# Patient Record
Sex: Male | Born: 1955 | Race: White | Hispanic: No | Smoking: Never smoker
Health system: Southern US, Community
[De-identification: ages and names within clinical notes are randomized; demographics above are authoritative.]

---

## 2017-10-08 ENCOUNTER — Ambulatory Visit
Admission: RE | Admit: 2017-10-08 | Discharge: 2017-10-08 | Disposition: A | Discharge status: Home / Self Care | Payer: Worker's Compensation | Source: Ambulatory Visit | Attending: Family Medicine | Admitting: Family Medicine

## 2017-10-08 ENCOUNTER — Other Ambulatory Visit: Payer: Self-pay | Admitting: Family Medicine

## 2017-10-08 DIAGNOSIS — X58XXXA Exposure to other specified factors, initial encounter: Secondary | ICD-10-CM | POA: Insufficient documentation

## 2017-10-08 DIAGNOSIS — S67193A Crushing injury of left middle finger, initial encounter: Secondary | ICD-10-CM | POA: Diagnosis present

## 2017-10-08 DIAGNOSIS — S62642A Nondisplaced fracture of proximal phalanx of right middle finger, initial encounter for closed fracture: Secondary | ICD-10-CM | POA: Insufficient documentation

## 2017-10-08 DIAGNOSIS — R52 Pain, unspecified: Secondary | ICD-10-CM

## 2017-12-10 ENCOUNTER — Other Ambulatory Visit: Payer: Self-pay | Admitting: Family Medicine

## 2017-12-10 ENCOUNTER — Ambulatory Visit
Admission: RE | Admit: 2017-12-10 | Discharge: 2017-12-10 | Disposition: A | Discharge status: Home / Self Care | Payer: Worker's Compensation | Source: Ambulatory Visit | Attending: Family Medicine | Admitting: Family Medicine

## 2017-12-10 DIAGNOSIS — M79646 Pain in unspecified finger(s): Secondary | ICD-10-CM | POA: Diagnosis present

## 2017-12-10 DIAGNOSIS — R52 Pain, unspecified: Secondary | ICD-10-CM

## 2017-12-10 DIAGNOSIS — S62633K Displaced fracture of distal phalanx of left middle finger, subsequent encounter for fracture with nonunion: Secondary | ICD-10-CM | POA: Insufficient documentation

## 2017-12-10 DIAGNOSIS — X58XXXD Exposure to other specified factors, subsequent encounter: Secondary | ICD-10-CM | POA: Insufficient documentation

## 2017-12-12 DIAGNOSIS — S62609A Fracture of unspecified phalanx of unspecified finger, initial encounter for closed fracture: Secondary | ICD-10-CM | POA: Insufficient documentation

## 2019-04-12 IMAGING — CR DG FINGER MIDDLE 2+V*L*
1 series · 3 of 3 positions shown · non-contrast
Comparison: None.

CLINICAL DATA: Pain due to crush injury

EXAM:
LEFT MIDDLE FINGER 2+V

[Series 1: dg finger middle left · 0.14mm/px · 3 of 3 slices shown]
[im 1/3]
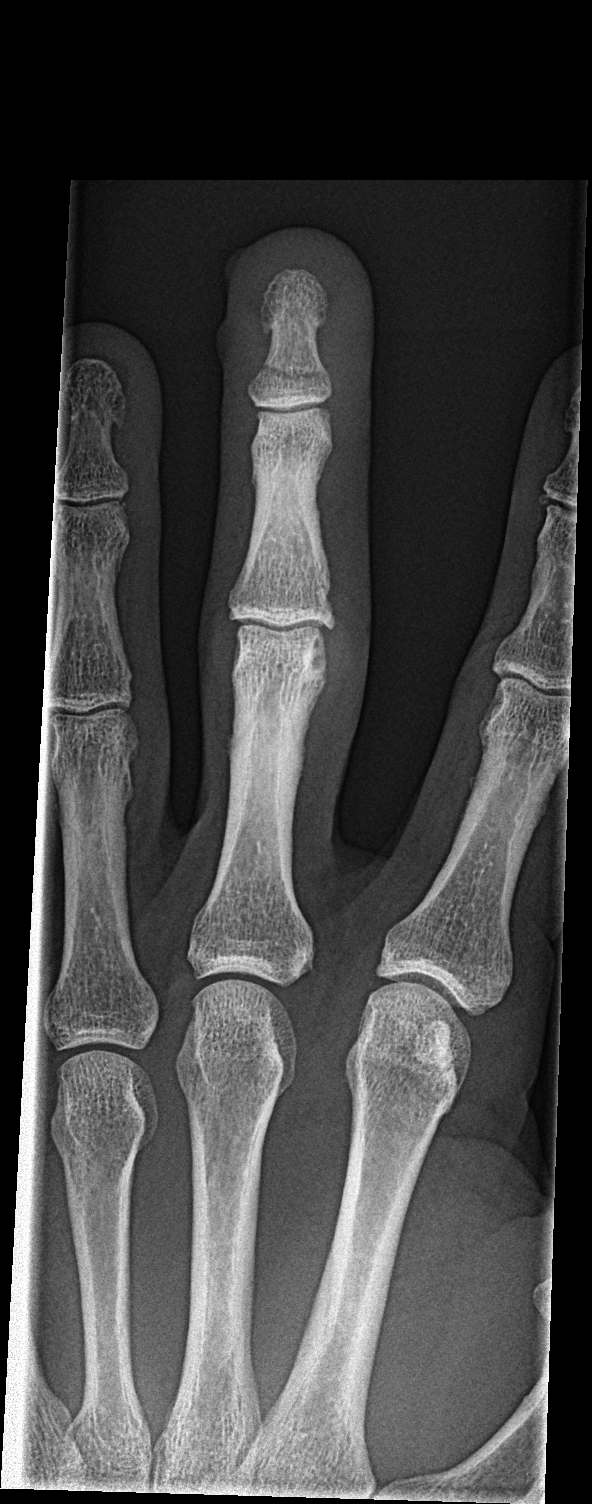
[im 2/3]
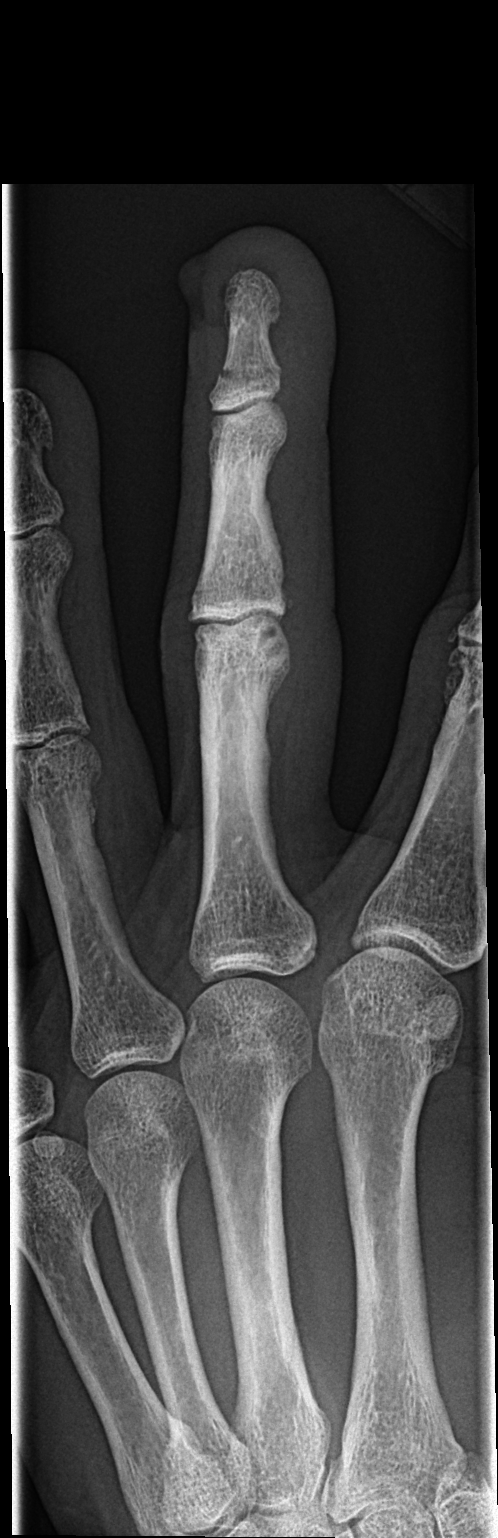
[im 3/3]
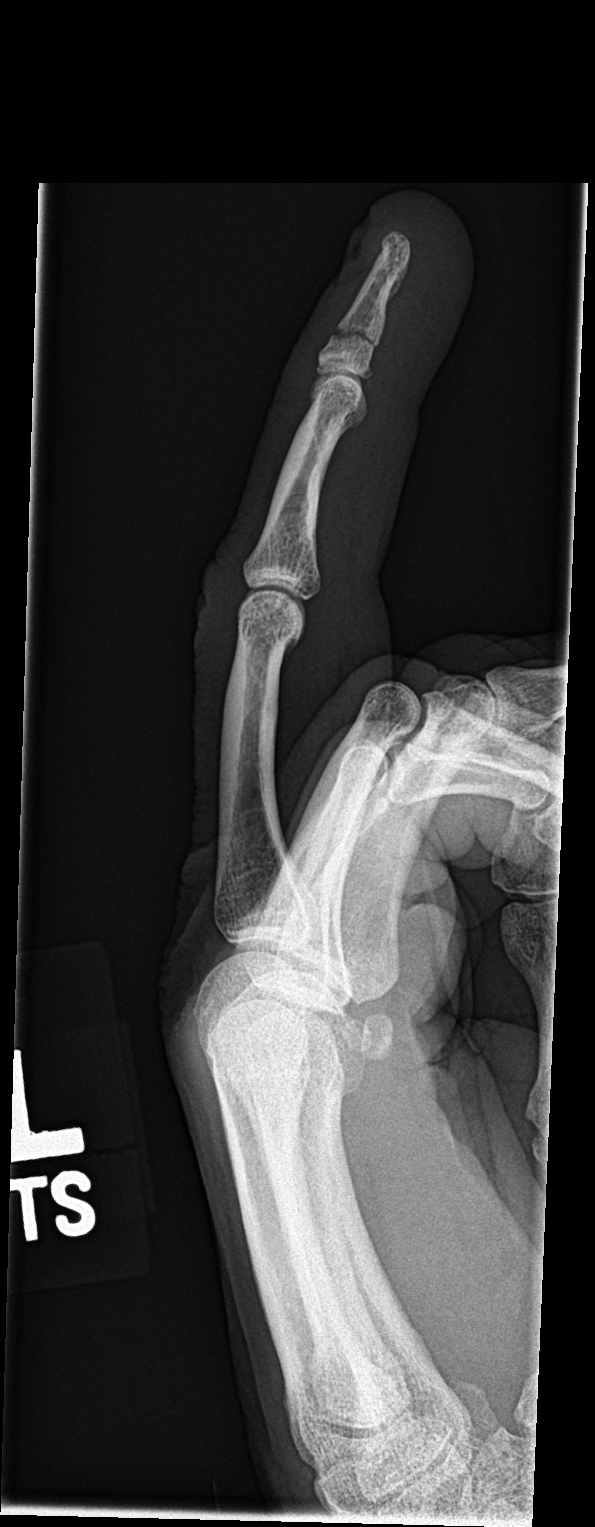

[3 of 3 positions shown; findings below may reference images not displayed]

FINDINGS: Nondisplaced acute fracture involving the proximal metaphysis of the
third distal phalanx without definitive articular extension. No
subluxation or radiopaque foreign body
IMPRESSION: Acute nondisplaced fracture involving the proximal aspect of the
third distal phalanx

## 2019-06-14 IMAGING — CR DG FINGER MIDDLE 2+V*L*
1 series · 3 of 3 positions shown · non-contrast
Comparison: Plain film of the left third finger dated 10/08/2017.

CLINICAL DATA: Pt fractured his left middle finger in September 2017
and started having redness/ edema distal phalanx 2 weeks ago. Pt
states he did not re injure his finger. shielded

EXAM:
LEFT MIDDLE FINGER 2+V

[Series 1: dg finger middle left · 0.14mm/px · 3 of 3 slices shown]
[im 1/3]
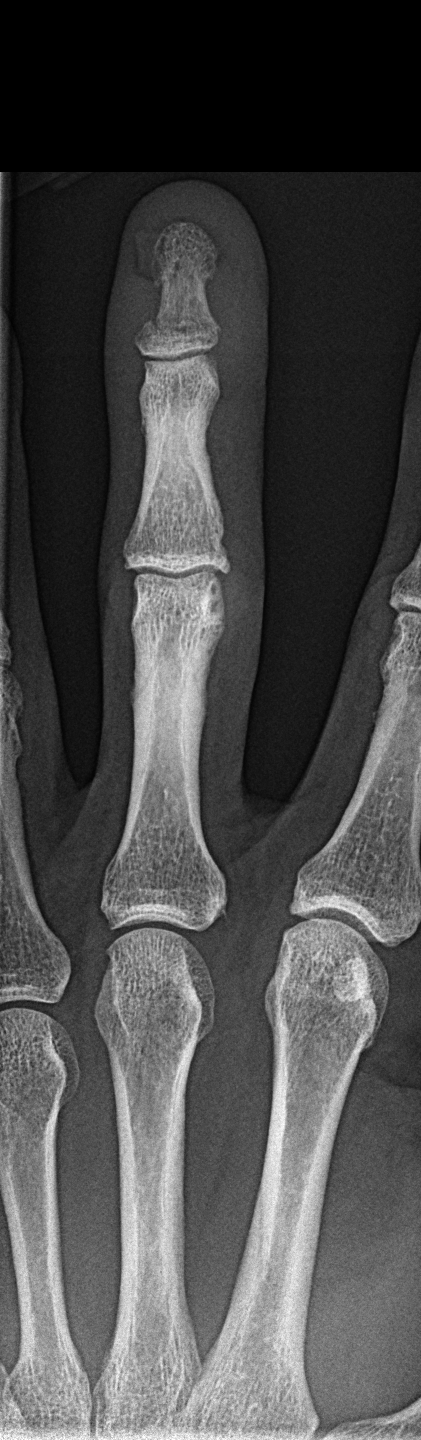
[im 2/3]
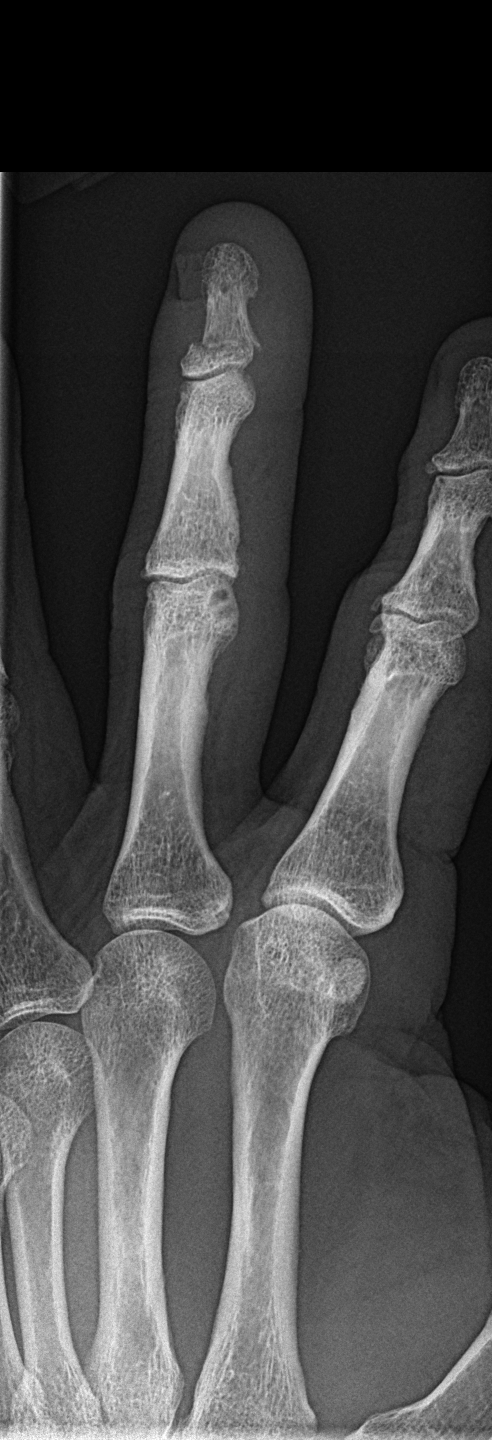
[im 3/3]
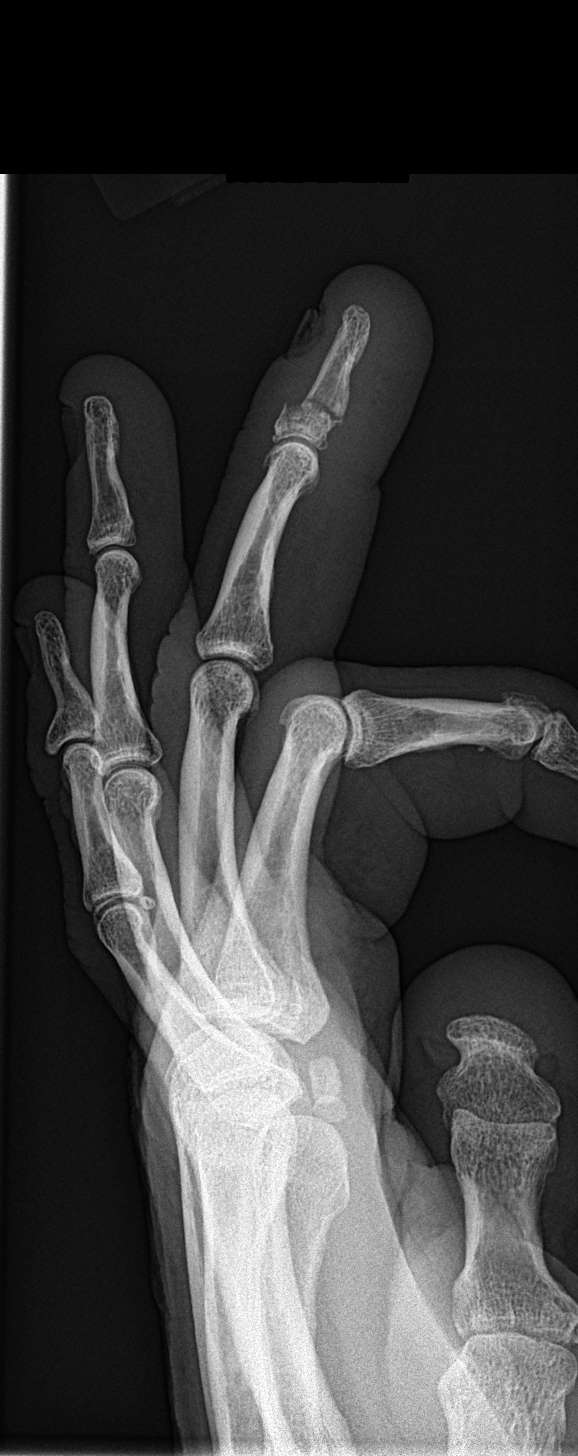

[3 of 3 positions shown; findings below may reference images not displayed]

FINDINGS: Slightly displaced fracture displacement at the base of the distal
phalanx, increased cortical offset which now measures approximately
1.5 mm. No convincing healing callus formation seen. Alignment at
the underlying DIP joint remains normal. Surrounding soft tissues
are unremarkable.
IMPRESSION: Increased fracture displacement at the base of the distal phalanx,
with cortical offset now measuring approximately 1.5 mm (no cortical
offset on the previous exam of 10/08/2017). No convincing healing
callus formation seen as yet.

## 2019-12-23 ENCOUNTER — Other Ambulatory Visit: Payer: Self-pay

## 2019-12-23 ENCOUNTER — Ambulatory Visit: Payer: Self-pay

## 2019-12-23 DIAGNOSIS — Z0000 Encounter for general adult medical examination without abnormal findings: Secondary | ICD-10-CM

## 2019-12-23 LAB — POCT URINALYSIS DIPSTICK
Bilirubin, UA: NEGATIVE
Blood, UA: NEGATIVE
Glucose, UA: NEGATIVE
Ketones, UA: NEGATIVE
Leukocytes, UA: NEGATIVE
Nitrite, UA: NEGATIVE
Protein, UA: NEGATIVE
Spec Grav, UA: 1.015 (ref 1.010–1.025)
Urobilinogen, UA: 0.2 E.U./dL
pH, UA: 6 (ref 5.0–8.0)

## 2019-12-23 NOTE — Progress Notes (Signed)
Patient comes in today for pre physical labs and EKG. Patient is scheduled with Durward Parcel, PA-C on 01/04/20.

## 2019-12-24 LAB — CMP12+LP+TP+TSH+6AC+PSA+CBC…
ALT: 17 IU/L (ref 0–44)
AST: 28 IU/L (ref 0–40)
Albumin/Globulin Ratio: 1.9 (ref 1.2–2.2)
Albumin: 4.5 g/dL (ref 3.8–4.8)
Alkaline Phosphatase: 65 IU/L (ref 39–117)
BUN/Creatinine Ratio: 12 (ref 10–24)
Basophils Absolute: 0.1 10*3/uL (ref 0.0–0.2)
Basos: 1 %
Calcium: 9.4 mg/dL (ref 8.6–10.2)
Chloride: 101 mmol/L (ref 96–106)
Chol/HDL Ratio: 2.2 ratio (ref 0.0–5.0)
Cholesterol, Total: 195 mg/dL (ref 100–199)
EOS (ABSOLUTE): 0.1 10*3/uL (ref 0.0–0.4)
Eos: 2 %
Free Thyroxine Index: 1.7 (ref 1.2–4.9)
GFR calc non Af Amer: 88 mL/min/{1.73_m2} (ref >59)
Globulin, Total: 2.4 g/dL (ref 1.5–4.5)
HDL: 90 mg/dL (ref >39)
Hematocrit: 43.7 % (ref 37.5–51.0)
Immature Grans (Abs): 0.1 10*3/uL (ref 0.0–0.1)
Immature Granulocytes: 1 %
LDH: 241 IU/L — ABNORMAL HIGH (ref 121–224)
MCH: 31.2 pg (ref 26.6–33.0)
MCV: 92 fL (ref 79–97)
Neutrophils Absolute: 3.3 10*3/uL (ref 1.4–7.0)
Phosphorus: 3.8 mg/dL (ref 2.8–4.1)
Prostate Specific Ag, Serum: 2.7 ng/mL (ref 0.0–4.0)
RDW: 12 % (ref 11.6–15.4)
TSH: 1.87 u[IU]/mL (ref 0.450–4.500)
Uric Acid: 5.9 mg/dL (ref 3.8–8.4)
WBC: 5.2 10*3/uL (ref 3.4–10.8)

## 2019-12-24 LAB — CMP12+LP+TP+TSH+6AC+PSA+CBC?
BUN: 11 mg/dL (ref 8–27)
Bilirubin Total: 0.4 mg/dL (ref 0.0–1.2)
Creatinine, Ser: 0.93 mg/dL (ref 0.76–1.27)
Estimated CHD Risk: <0.5 times avg. (ref 0.0–1.0)
GFR calc Af Amer: 101 mL/min/{1.73_m2} (ref >59)
GGT: 20 IU/L (ref 0–65)
Glucose: 93 mg/dL (ref 65–99)
Hemoglobin: 14.9 g/dL (ref 13.0–17.7)
Iron: 69 ug/dL (ref 38–169)
LDL Chol Calc (NIH): 96 mg/dL (ref 0–99)
Lymphocytes Absolute: 1.2 10*3/uL (ref 0.7–3.1)
Lymphs: 23 %
MCHC: 34.1 g/dL (ref 31.5–35.7)
Monocytes Absolute: 0.5 10*3/uL (ref 0.1–0.9)
Monocytes: 9 %
Neutrophils: 64 %
Platelets: 143 10*3/uL — ABNORMAL LOW (ref 150–450)
Potassium: 5.1 mmol/L (ref 3.5–5.2)
RBC: 4.77 x10E6/uL (ref 4.14–5.80)
Sodium: 139 mmol/L (ref 134–144)
T3 Uptake Ratio: 24 % (ref 24–39)
T4, Total: 6.9 ug/dL (ref 4.5–12.0)
Total Protein: 6.9 g/dL (ref 6.0–8.5)
Triglycerides: 46 mg/dL (ref 0–149)
VLDL Cholesterol Cal: 9 mg/dL (ref 5–40)

## 2020-01-04 ENCOUNTER — Ambulatory Visit: Payer: Self-pay | Admitting: Physician Assistant

## 2020-01-04 ENCOUNTER — Other Ambulatory Visit: Payer: Self-pay

## 2020-01-04 VITALS — BP 108/80 | HR 80 | Temp 98.3°F | Ht 73.0 in | Wt 160.6 lb

## 2020-01-04 DIAGNOSIS — Z0000 Encounter for general adult medical examination without abnormal findings: Secondary | ICD-10-CM

## 2020-01-04 MED ORDER — SILDENAFIL CITRATE 100 MG PO TABS: 50.0000 mg | ORAL_TABLET | Freq: Every day | ORAL | 11 refills | Status: DC | PRN

## 2020-01-04 NOTE — Progress Notes (Signed)
   Subjective: Routine annual physical    Patient ID: Gabriel George, male    DOB: December 08, 1955, 64 y.o.   MRN: [id number]  HPI Patient presents for routine annual physical was normal.  Complaints   Review of Systems Unremarkable    Objective:   Physical Exam HEENT was unremarkable.  Neck is supple without adenopathy or bruits.  Lungs are clear to auscultation.  Heart regular rate and rhythm.  Abdomen with negative HSM.  Normoactive bowel sounds.  Soft nontender palpation.  No obvious deformity to cervical lumbar spine.  Patient full equal range of motion cervical lumbar spine.  No obvious deformity to the lower and upper extremities.  Patient full equal range of motion of the lower and upper ex extremities.  Cranial nerves II through XII grossly intact.       Assessment & Plan: Well exam.  Discussed lab results with patient advised follow-up as necessary.

## 2020-11-08 ENCOUNTER — Ambulatory Visit: Payer: Self-pay | Admitting: Nurse Practitioner

## 2020-11-08 ENCOUNTER — Other Ambulatory Visit: Payer: Self-pay

## 2020-11-08 ENCOUNTER — Encounter: Payer: Self-pay | Admitting: Nurse Practitioner

## 2020-11-08 VITALS — BP 120/74 | HR 99 | Temp 98.4°F | Resp 14 | Ht 73.0 in | Wt 160.0 lb

## 2020-11-08 DIAGNOSIS — M25441 Effusion, right hand: Secondary | ICD-10-CM

## 2020-11-08 NOTE — Progress Notes (Signed)
Pt presents today with right hand pain for about 2 weeks. Pt middle knuckle and finger bother him he stated, that he cant touch his middle finger to the middle palm of his right hand and the knuckle area is swollen.

## 2020-11-08 NOTE — Progress Notes (Signed)
   Subjective:    Patient ID: Gabriel George, male    DOB: [birth year], 64 y.o.   MRN: [id number]  HPI 64 year old male presenting with on and off right hand middle finger proximal joint swelling with decreased ROM.   He has taken Aleve intermittently for relief.   About a month ago his dog leash got wrapped around his middle finger on his right hand and pulled him and he knows he injured it at that time but did not seek medical care.   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   After working his hand will swell.  Denies any numbness or change in sensation to right hand.   No other generalized joint swelling systemically, father had arthritis  Today's Vitals   11/08/20 1449  BP: 120/74  Pulse: 99  Resp: 14  Temp: 98.4 F (36.9 C)  SpO2: 95%  Weight: 160 lb (72.6 kg)  Height: 6\' 1"  (1.854 m)   Body mass index is 21.11 kg/m. Review of Systems  Constitutional: Negative.   HENT: Negative.   Respiratory: Negative.   Cardiovascular: Negative.   Musculoskeletal: Positive for arthralgias and joint swelling.       Objective:   Physical Exam Constitutional:      Appearance: He is normal weight.  Musculoskeletal:     Right hand: Swelling and tenderness present. Decreased range of motion. Normal strength. Normal sensation.       Hands:     Comments: Right hand third finger proximal joint with slight inflammation, unable to touch third finger to palm of hand. Pain at joint on palpation (dorsal and ventral) Sensation intact throughout finger Distal joints without swelling or erythema Skin color uniform throughout   Skin:    General: Skin is dry.  Neurological:     Mental Status: He is alert and oriented to person, place, and time.  Psychiatric:        Mood and Affect: Mood normal.           Assessment & Plan:  Offered XRAY of finger to assess for injury secondary to incident with dog last  month He may try Aleve consistently for one week and decrease work with hands first and if symptoms persist continue with XRAY for evaluation  RTC if symptoms persist/worsen or with new concerns  Discussed the opportunity for bloodwork if he would like to pursue a workup for arthritis as well

## 2020-12-27 NOTE — Progress Notes (Signed)
Pt scheduled to complete physical 01/04/21./CL,RMA

## 2020-12-28 ENCOUNTER — Ambulatory Visit: Payer: Self-pay

## 2020-12-28 ENCOUNTER — Other Ambulatory Visit: Payer: Self-pay

## 2020-12-28 DIAGNOSIS — Z0000 Encounter for general adult medical examination without abnormal findings: Secondary | ICD-10-CM

## 2020-12-28 DIAGNOSIS — Z01818 Encounter for other preprocedural examination: Secondary | ICD-10-CM

## 2020-12-28 LAB — POCT URINALYSIS DIPSTICK
Bilirubin, UA: NEGATIVE
Blood, UA: NEGATIVE
Glucose, UA: NEGATIVE
Ketones, UA: NEGATIVE
Leukocytes, UA: NEGATIVE
Nitrite, UA: NEGATIVE
Protein, UA: NEGATIVE
Spec Grav, UA: 1.025 (ref 1.010–1.025)
Urobilinogen, UA: 0.2 E.U./dL
pH, UA: 6 (ref 5.0–8.0)

## 2020-12-29 LAB — CMP12+LP+TP+TSH+6AC+PSA+CBC…
ALT: 22 IU/L (ref 0–44)
AST: 25 IU/L (ref 0–40)
Albumin/Globulin Ratio: 1.8 (ref 1.2–2.2)
Albumin: 4.4 g/dL (ref 3.8–4.8)
Alkaline Phosphatase: 79 IU/L (ref 44–121)
BUN/Creatinine Ratio: 13 (ref 10–24)
BUN: 12 mg/dL (ref 8–27)
Basophils Absolute: 0.1 10*3/uL (ref 0.0–0.2)
Basos: 1 %
Bilirubin Total: 0.6 mg/dL (ref 0.0–1.2)
Calcium: 9.7 mg/dL (ref 8.6–10.2)
Chloride: 102 mmol/L (ref 96–106)
Chol/HDL Ratio: 3.1 ratio (ref 0.0–5.0)
Cholesterol, Total: 207 mg/dL — ABNORMAL HIGH (ref 100–199)
Creatinine, Ser: 0.94 mg/dL (ref 0.76–1.27)
EOS (ABSOLUTE): 0.1 10*3/uL (ref 0.0–0.4)
Eos: 1 %
Estimated CHD Risk: <0.5 times avg. (ref 0.0–1.0)
Free Thyroxine Index: 2.2 (ref 1.2–4.9)
GFR calc Af Amer: 99 mL/min/{1.73_m2} (ref >59)
GFR calc non Af Amer: 86 mL/min/{1.73_m2} (ref >59)
GGT: 20 IU/L (ref 0–65)
Globulin, Total: 2.5 g/dL (ref 1.5–4.5)
Glucose: 89 mg/dL (ref 65–99)
HDL: 66 mg/dL (ref >39)
Hematocrit: 41.6 % (ref 37.5–51.0)
Hemoglobin: 14.5 g/dL (ref 13.0–17.7)
Immature Grans (Abs): 0 10*3/uL (ref 0.0–0.1)
Immature Granulocytes: 1 %
Iron: 115 ug/dL (ref 38–169)
LDH: 180 IU/L (ref 121–224)
LDL Chol Calc (NIH): 129 mg/dL — ABNORMAL HIGH (ref 0–99)
Lymphocytes Absolute: 1.4 10*3/uL (ref 0.7–3.1)
Lymphs: 23 %
MCH: 30.5 pg (ref 26.6–33.0)
MCHC: 34.9 g/dL (ref 31.5–35.7)
MCV: 88 fL (ref 79–97)
Monocytes Absolute: 0.5 10*3/uL (ref 0.1–0.9)
Monocytes: 7 %
Neutrophils Absolute: 4.1 10*3/uL (ref 1.4–7.0)
Neutrophils: 67 %
Phosphorus: 3.5 mg/dL (ref 2.8–4.1)
Platelets: 166 10*3/uL (ref 150–450)
Potassium: 4.6 mmol/L (ref 3.5–5.2)
Prostate Specific Ag, Serum: 2.5 ng/mL (ref 0.0–4.0)
RBC: 4.75 x10E6/uL (ref 4.14–5.80)
RDW: 11.6 % (ref 11.6–15.4)
Sodium: 140 mmol/L (ref 134–144)
T3 Uptake Ratio: 27 % (ref 24–39)
T4, Total: 8 ug/dL (ref 4.5–12.0)
TSH: 1.43 u[IU]/mL (ref 0.450–4.500)
Total Protein: 6.9 g/dL (ref 6.0–8.5)
Triglycerides: 65 mg/dL (ref 0–149)
Uric Acid: 6 mg/dL (ref 3.8–8.4)
VLDL Cholesterol Cal: 12 mg/dL (ref 5–40)
WBC: 6.2 10*3/uL (ref 3.4–10.8)

## 2021-01-04 ENCOUNTER — Encounter: Payer: Self-pay | Admitting: Adult Medicine

## 2021-01-04 ENCOUNTER — Ambulatory Visit: Payer: 59 | Admitting: Adult Medicine

## 2021-01-04 ENCOUNTER — Other Ambulatory Visit: Payer: Self-pay | Admitting: Adult Medicine

## 2021-01-04 ENCOUNTER — Other Ambulatory Visit: Payer: Self-pay

## 2021-01-04 VITALS — BP 123/86 | HR 76 | Temp 98.0°F | Resp 12 | Ht 73.0 in | Wt 164.0 lb

## 2021-01-04 DIAGNOSIS — D696 Thrombocytopenia, unspecified: Secondary | ICD-10-CM

## 2021-01-04 DIAGNOSIS — Z0000 Encounter for general adult medical examination without abnormal findings: Secondary | ICD-10-CM

## 2021-01-04 NOTE — Progress Notes (Signed)
2 at home Covid tests - Negative  Works outside  Voice a little raspy - said it's improved

## 2021-01-04 NOTE — Progress Notes (Signed)
HPI Gabriel George is a 65 y.o. 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 No pertinent past medical history.  Patient Active Problem List   Diagnosis Date Noted  . Fracture of phalanx of finger 12/12/2017    History reviewed. No pertinent surgical history.  Medication Sig Start Date End Date Taking? Authorizing Provider  sildenafil (VIAGRA) 100 MG tablet Take 0.5-1 tablets (50-100 mg total) by mouth daily as needed for erectile dysfunction. 01/04/20  Yes Joni Reining, PA-C    Allergies Patient has no known allergies.  History reviewed. No pertinent family history.  Social History Social History   Tobacco Use  . Smoking status: Never Smoker  . Smokeless tobacco: Current User    Types: Chew  Substance Use Topics  . Alcohol use: Yes    Review of Systems Constitutional: No fever/chills Eyes: No visual changes. ENT: No sore throat. Cardiovascular: Denies chest pain. Respiratory: Denies shortness of breath. Gastrointestinal: No abdominal pain.  No nausea, no vomiting.  No diarrhea.  No constipation. Genitourinary: Negative for dysuria. Improved ED Musculoskeletal: Negative for back pain. Skin: Negative for rash. Neurological: Negative for headaches, focal weakness or numbness. Psychiatric: stable mood   ___________________________________  PHYSICAL EXAM:  VITAL SIGNS:Normotensive Constitutional: Alert and oriented. Well appearing and in no acute distress. Eyes: Conjunctivae are normal. PERRL. EOMI. Head: Atraumatic. Nose: No congestion/rhinnorhea. Mouth/Throat: Mucous membranes are moist.  Oropharynx non-erythematous. Neck: No stridor. No cervical spine tenderness to palpation. No cervical lymphadenopathy. Neg virchow Cardiovascular: Normal rate, regular rhythm. Grossly normal heart sounds.  Good peripheral circulation. Respiratory: Normal respiratory effort.  No  retractions. Lungs CTAB. Gastrointestinal: Soft and nontender. No distention. No abdominal bruits. No CVA tenderness. Genitourinary: 2 scrotal nl size testes, prostrate no nodular nontender size3x2.5 giuac neg  Musculoskeletal: No lower extremity tenderness nor edema.  No joint effusions. Neurologic:  Normal speech and language. No gross focal neurologic deficits are appreciated. No gait instability. Skin:  Skin is warm, dry and intact. No rash noted. Superficial nontortuous LE veins present Psychiatric: Mood and affect are normal. Speech and behavior are normal.  ___________________________________   LABS Results for AUBERT, CHOYCE "REGGIE" (MRN [id number]) as of 01/04/2021 08:34  Ref. Range 12/23/2019 08:55 12/28/2020 09:04  Sodium Latest Ref Range: 134 - 144 mmol/L 139 140  Potassium Latest Ref Range: 3.5 - 5.2 mmol/L 5.1 4.6  Chloride Latest Ref Range: 96 - 106 mmol/L 101 102  Glucose Latest Ref Range: 65 - 99 mg/dL 93 89  BUN Latest Ref Range: 8 - 27 mg/dL 11 12  Creatinine Latest Ref Range: 0.76 - 1.27 mg/dL 5.05 3.97  Calcium Latest Ref Range: 8.6 - 10.2 mg/dL 9.4 9.7  BUN/Creatinine Ratio Latest Ref Range: 10 - 24  12 13   Phosphorus Latest Ref Range: 2.8 - 4.1 mg/dL 3.8 3.5  Alkaline Phosphatase Latest Ref Range: 44 - 121 IU/L 65 79  Albumin Latest Ref Range: 3.8 - 4.8 g/dL 4.5 4.4  Albumin/Globulin Ratio Latest Ref Range: 1.2 - 2.2  1.9 1.8  Uric Acid Latest Ref Range: 3.8 - 8.4 mg/dL 5.9 6.0  AST Latest Ref Range: 0 - 40 IU/L 28 25  ALT Latest Ref Range: 0 - 44 IU/L 17 22  Total Protein Latest Ref Range: 6.0 - 8.5 g/dL 6.9 6.9  Total Bilirubin Latest Ref Range: 0.0 - 1.2 mg/dL 0.4 0.6  GGT Latest Ref Range: 0 - 65  IU/L 20 20  GFR, Est Non African American Latest Ref Range: >59 mL/min/1.73 88 86  GFR, Est African American Latest Ref Range: >59 mL/min/1.73 101 99  Estimated CHD Risk Latest Ref Range: 0.0 - 1.0 times avg. < 0.5 < 0.5  LDH Latest Ref Range: 121 - 224 IU/L 241  (H) 180  Total CHOL/HDL Ratio Latest Ref Range: 0.0 - 5.0 ratio 2.2 3.1  Cholesterol, Total Latest Ref Range: 100 - 199 mg/dL 536 144 (H)  HDL Cholesterol Latest Ref Range: >39 mg/dL 90 66  Triglycerides Latest Ref Range: 0 - 149 mg/dL 46 65  VLDL Cholesterol Cal Latest Ref Range: 5 - 40 mg/dL 9 12  LDL Chol Calc (NIH) Latest Ref Range: 0 - 99 mg/dL 96 315 (H)  Iron Latest Ref Range: 38 - 169 ug/dL 69 400  Globulin, Total Latest Ref Range: 1.5 - 4.5 g/dL 2.4 2.5  WBC Latest Ref Range: 3.4 - 10.8 x10E3/uL 5.2 6.2  RBC Latest Ref Range: 4.14 - 5.80 x10E6/uL 4.77 4.75  Hemoglobin Latest Ref Range: 13.0 - 17.7 g/dL 86.7 61.9  HCT Latest Ref Range: 37.5 - 51.0 % 43.7 41.6  MCV Latest Ref Range: 79 - 97 fL 92 88  MCH Latest Ref Range: 26.6 - 33.0 pg 31.2 30.5  MCHC Latest Ref Range: 31.5 - 35.7 g/dL 50.9 32.6  RDW Latest Ref Range: 11.6 - 15.4 % 12.0 11.6  Platelets Latest Ref Range: 150 - 450 x10E3/uL 143 (L) 166  Neutrophils Latest Ref Range: Not Estab. % 64 67  Immature Granulocytes Latest Ref Range: Not Estab. % 1 1  NEUT# Latest Ref Range: 1.4 - 7.0 x10E3/uL 3.3 4.1  Lymphocyte # Latest Ref Range: 0.7 - 3.1 x10E3/uL 1.2 1.4  Monocytes Absolute Latest Ref Range: 0.1 - 0.9 x10E3/uL 0.5 0.5  Basophils Absolute Latest Ref Range: 0.0 - 0.2 x10E3/uL 0.1 0.1  Immature Grans (Abs) Latest Ref Range: 0.0 - 0.1 x10E3/uL 0.1 0.0  Lymphs Latest Ref Range: Not Estab. % 23 23  Monocytes Latest Ref Range: Not Estab. % 9 7  Basos Latest Ref Range: Not Estab. % 1 1  Eos Latest Ref Range: Not Estab. % 2 1  EOS (ABSOLUTE) Latest Ref Range: 0.0 - 0.4 x10E3/uL 0.1 0.1  TSH Latest Ref Range: 0.450 - 4.500 uIU/mL 1.870 1.430  Thyroxine (T4) Latest Ref Range: 4.5 - 12.0 ug/dL 6.9 8.0  Free Thyroxine Index Latest Ref Range: 1.2 - 4.9  1.7 2.2  T3 Uptake Ratio Latest Ref Range: 24 - 39 % 24 27  Prostate Specific Ag, Serum Latest Ref Range: 0.0 - 4.0 ng/mL 2.7 2.5   EKG  nsr nl axis morphology interval  without ectopy   INITIAL IMPRESSION / ASSESSMENT   Well Annual exam client is active and without pain or limitation works full time.   Discussed lab value LDH modest elevation   He has modest Etoh, chews tobacco 2xdaily  platlets 143(>150)otherwise nl findings  Repeat cbc platlet in 22months  schedule next annual  Return as needed.

## 2021-10-24 DIAGNOSIS — M1711 Unilateral primary osteoarthritis, right knee: Secondary | ICD-10-CM | POA: Diagnosis not present

## 2021-12-08 DIAGNOSIS — M1711 Unilateral primary osteoarthritis, right knee: Secondary | ICD-10-CM | POA: Diagnosis not present

## 2021-12-13 ENCOUNTER — Ambulatory Visit: Payer: Self-pay

## 2021-12-13 ENCOUNTER — Other Ambulatory Visit: Payer: Self-pay

## 2021-12-13 DIAGNOSIS — Z0000 Encounter for general adult medical examination without abnormal findings: Secondary | ICD-10-CM

## 2021-12-13 LAB — POCT URINALYSIS DIPSTICK
Bilirubin, UA: NEGATIVE
Blood, UA: NEGATIVE
Glucose, UA: NEGATIVE
Ketones, UA: NEGATIVE
Leukocytes, UA: NEGATIVE
Nitrite, UA: NEGATIVE
Protein, UA: NEGATIVE
Spec Grav, UA: 1.01 (ref 1.010–1.025)
Urobilinogen, UA: 0.2 E.U./dL
pH, UA: 6 (ref 5.0–8.0)

## 2021-12-13 NOTE — Progress Notes (Signed)
12/20/21 annual physical scheduled.

## 2021-12-14 LAB — CMP12+LP+TP+TSH+6AC+PSA+CBC…
ALT: 19 IU/L (ref 0–44)
AST: 24 IU/L (ref 0–40)
Albumin/Globulin Ratio: 1.8 (ref 1.2–2.2)
Albumin: 4.6 g/dL (ref 3.8–4.8)
Alkaline Phosphatase: 72 IU/L (ref 44–121)
BUN/Creatinine Ratio: 13 (ref 10–24)
BUN: 14 mg/dL (ref 8–27)
Basophils Absolute: 0.1 10*3/uL (ref 0.0–0.2)
Basos: 2 %
Bilirubin Total: 0.9 mg/dL (ref 0.0–1.2)
Calcium: 9.4 mg/dL (ref 8.6–10.2)
Chloride: 98 mmol/L (ref 96–106)
Chol/HDL Ratio: 2.6 ratio (ref 0.0–5.0)
Cholesterol, Total: 226 mg/dL — ABNORMAL HIGH (ref 100–199)
Creatinine, Ser: 1.06 mg/dL (ref 0.76–1.27)
EOS (ABSOLUTE): 0.1 10*3/uL (ref 0.0–0.4)
Eos: 2 %
Estimated CHD Risk: <0.5 times avg. (ref 0.0–1.0)
Free Thyroxine Index: 1.9 (ref 1.2–4.9)
GGT: 16 IU/L (ref 0–65)
Globulin, Total: 2.5 g/dL (ref 1.5–4.5)
Glucose: 92 mg/dL (ref 70–99)
HDL: 88 mg/dL (ref >39)
Hematocrit: 45.3 % (ref 37.5–51.0)
Hemoglobin: 15.4 g/dL (ref 13.0–17.7)
Immature Grans (Abs): 0 10*3/uL (ref 0.0–0.1)
Immature Granulocytes: 0 %
Iron: 159 ug/dL (ref 38–169)
LDH: 176 IU/L (ref 121–224)
LDL Chol Calc (NIH): 126 mg/dL — ABNORMAL HIGH (ref 0–99)
Lymphocytes Absolute: 1.4 10*3/uL (ref 0.7–3.1)
Lymphs: 29 %
MCH: 30.6 pg (ref 26.6–33.0)
MCHC: 34 g/dL (ref 31.5–35.7)
MCV: 90 fL (ref 79–97)
Monocytes Absolute: 0.4 10*3/uL (ref 0.1–0.9)
Monocytes: 9 %
Neutrophils Absolute: 2.7 10*3/uL (ref 1.4–7.0)
Neutrophils: 58 %
Phosphorus: 3.3 mg/dL (ref 2.8–4.1)
Platelets: 175 10*3/uL (ref 150–450)
Potassium: 4.6 mmol/L (ref 3.5–5.2)
Prostate Specific Ag, Serum: 2.2 ng/mL (ref 0.0–4.0)
RBC: 5.04 x10E6/uL (ref 4.14–5.80)
RDW: 12.1 % (ref 11.6–15.4)
Sodium: 136 mmol/L (ref 134–144)
T3 Uptake Ratio: 24 % (ref 24–39)
T4, Total: 7.8 ug/dL (ref 4.5–12.0)
TSH: 2.63 u[IU]/mL (ref 0.450–4.500)
Total Protein: 7.1 g/dL (ref 6.0–8.5)
Triglycerides: 72 mg/dL (ref 0–149)
Uric Acid: 5.9 mg/dL (ref 3.8–8.4)
VLDL Cholesterol Cal: 12 mg/dL (ref 5–40)
WBC: 4.7 10*3/uL (ref 3.4–10.8)
eGFR: 78 mL/min/{1.73_m2} (ref >59)

## 2021-12-20 ENCOUNTER — Ambulatory Visit
Admission: RE | Admit: 2021-12-20 | Discharge: 2021-12-20 | Disposition: A | Discharge status: Home / Self Care | Payer: 59 | Attending: Physician Assistant | Admitting: Physician Assistant

## 2021-12-20 ENCOUNTER — Ambulatory Visit
Admission: RE | Admit: 2021-12-20 | Discharge: 2021-12-20 | Disposition: A | Discharge status: Home / Self Care | Payer: 59 | Source: Ambulatory Visit | Attending: Physician Assistant | Admitting: Physician Assistant

## 2021-12-20 ENCOUNTER — Encounter: Payer: Self-pay | Admitting: Physician Assistant

## 2021-12-20 ENCOUNTER — Ambulatory Visit: Payer: Self-pay | Admitting: Physician Assistant

## 2021-12-20 ENCOUNTER — Other Ambulatory Visit: Payer: Self-pay

## 2021-12-20 VITALS — BP 132/90 | HR 84 | Temp 98.0°F | Resp 14 | Ht 73.0 in | Wt 160.0 lb

## 2021-12-20 DIAGNOSIS — G8929 Other chronic pain: Secondary | ICD-10-CM | POA: Diagnosis not present

## 2021-12-20 DIAGNOSIS — M79642 Pain in left hand: Secondary | ICD-10-CM | POA: Insufficient documentation

## 2021-12-20 MED ORDER — DICLOFENAC SODIUM 1 % EX GEL: 2.0000 g | Freq: Four times a day (QID) | CUTANEOUS | Status: DC

## 2021-12-20 NOTE — Progress Notes (Signed)
[place]  ____________________________________________   None    (approximate)  I have reviewed the triage vital signs and the nursing notes.   HISTORY  Chief Complaint Annual Exam    HPI Gabriel George is a 66 y.o. male patient presents for annual physical exam.  Patient was concerned for pain to the left second MCPJ for approximately 3 months.  Patient states pain is worse with a.m. awakening to get better during the day.  Patient is left-hand dominant.  Denies any provocative incident for complaint.  Patient is also been advised to have a right knee replacement.         History reviewed. No pertinent past medical history.  Patient Active Problem List   Diagnosis Date Noted   Fracture of phalanx of finger 12/12/2017    History reviewed. No pertinent surgical history.  Prior to Admission medications   Medication Sig Start Date End Date Taking? Authorizing Provider  sildenafil (VIAGRA) 100 MG tablet Take 0.5-1 tablets (50-100 mg total) by mouth daily as needed for erectile dysfunction. 01/04/20   Sable Feil, PA-C    Allergies Patient has no known allergies.  History reviewed. No pertinent family history.  Social History Social History   Tobacco Use   Smoking status: Never   Smokeless tobacco: Current    Types: Chew  Substance Use Topics   Alcohol use: Yes    Review of Systems Constitutional: No fever/chills Eyes: No visual changes. ENT: No sore throat. Cardiovascular: Denies chest pain. Respiratory: Denies shortness of breath. Gastrointestinal: No abdominal pain.  No nausea, no vomiting.  No diarrhea.  No constipation. Genitourinary: Negative for dysuria.  Erectile dysfunction. Musculoskeletal: Left hand pain. Skin: Negative for rash. Neurological: Negative for headaches, focal weakness or numbness.  ____________________________________________   PHYSICAL EXAM:  VITAL SIGNS: Temperature 98, pulse 84, respiration 14, BP is  132/90 and patient 90% O2 sat on room air.  Patient weighs 160 pounds and BMI is 21.11 Constitutional: Alert and oriented. Well appearing and in no acute distress. Eyes: Conjunctivae are normal. PERRL. EOMI. Head: Atraumatic. Nose: No congestion/rhinnorhea. Mouth/Throat: Mucous membranes are moist.  Oropharynx non-erythematous. Neck: No stridor.  No cervical spine tenderness to palpation. Hematological/Lymphatic/Immunilogical: No cervical lymphadenopathy. Cardiovascular: Normal rate, regular rhythm. Grossly normal heart sounds.  Good peripheral circulation. Respiratory: Normal respiratory effort.  No retractions. Lungs CTAB. Gastrointestinal: Soft and nontender. No distention. No abdominal bruits. No CVA tenderness patient has moderate guarding palpation of the MCPJ second digit left hand.   Genitourinary: Deferred Musculoskeletal: No obvious deformities to upper or lower lower extremity, moderate tenderness nor edema.  No joint effusions. Neurologic:  Normal speech and language. No gross focal neurologic deficits are appreciated. No gait instability. Skin:  Skin is warm, dry and intact. No rash noted. Psychiatric: Mood and affect are normal. Speech and behavior are normal.  ____________________________________________   LABS        Component Ref Range & Units 7 d ago 11 mo ago 1 yr ago  Glucose 70 - 99 mg/dL 92  89 R  93 R   Uric Acid 3.8 - 8.4 mg/dL 5.9  6.0 CM  5.9 CM   Comment:            Therapeutic target for gout patients: <6.0  BUN 8 - 27 mg/dL _0 Creatinine, Ser 0.76 - 1.27 mg/dL 1.06  0.94  0.93   eGFR >59 mL/min/1.73 78     BUN/Creatinine Ratio 10 -  _0 Sodium 134 - 144 mmol/L 136  140  139   Potassium 3.5 - 5.2 mmol/L 4.6  4.6  5.1   Chloride 96 - 106 mmol/L 98  102  101   Calcium 8.6 - 10.2 mg/dL 9.4  9.7  9.4   Phosphorus 2.8 - 4.1 mg/dL 3.3  3.5  3.8   Total Protein 6.0 - 8.5 g/dL 7.1  6.9  6.9   Albumin 3.8 - 4.8 g/dL 4.6  4.4  4.5    Globulin, Total 1.5 - 4.5 g/dL 2.5  2.5  2.4   Albumin/Globulin Ratio 1.2 - 2.2 1.8  1.8  1.9   Bilirubin Total 0.0 - 1.2 mg/dL 0.9  0.6  0.4   Alkaline Phosphatase 44 - 121 IU/L 72  79  65 R   LDH 121 - 224 IU/L 176  180  241 High    AST 0 - 40 IU/L _1 ALT 0 - 44 IU/L _2 GGT 0 - 65 IU/L _3 Iron 38 - 169 ug/dL 159  115  69   Cholesterol, Total 100 - 199 mg/dL 226 High   207 High   195   Triglycerides 0 - 149 mg/dL 72  65  46   HDL >39 mg/dL 88  66  90   VLDL Cholesterol Cal 5 - 40 mg/dL _4 LDL Chol Calc (NIH) 0 - 99 mg/dL 126 High   129 High   96   Chol/HDL Ratio 0.0 - 5.0 ratio 2.6  3.1 CM  2.2 CM   Comment:                                   T. Chol/HDL Ratio                                              Men  Women                                1/2 Avg.Risk  3.4    3.3                                    Avg.Risk  5.0    4.4                                 2X Avg.Risk  9.6    7.1                                 3X Avg.Risk 23.4   11.0   Estimated CHD Risk 0.0 - 1.0 times avg.  < 0.5   < 0.5 CM   < 0.5 CM   Comment: The CHD Risk is based on the T. Chol/HDL ratio. Other  factors affect CHD Risk such as hypertension, smoking,  diabetes, severe obesity, and family history of  premature CHD.   TSH  0.450 - 4.500 uIU/mL 2.630  1.430  1.870   T4, Total 4.5 - 12.0 ug/dL 7.8  8.0  6.9   T3 Uptake Ratio 24 - 39 % _0 Free Thyroxine Index 1.2 - 4.9 1.9  2.2  1.7   Prostate Specific Ag, Serum 0.0 - 4.0 ng/mL 2.2  2.5 CM  2.7 CM   Comment: Roche ECLIA methodology.  According to the American Urological Association, Serum PSA should  decrease and remain at undetectable levels after radical  prostatectomy. The AUA defines biochemical recurrence as an initial  PSA value 0.2 ng/mL or greater followed by a subsequent confirmatory  PSA value 0.2 ng/mL or greater.  Values obtained with different assay methods or kits cannot be used  interchangeably.  Results cannot be interpreted as absolute evidence  of the presence or absence of malignant disease.   WBC 3.4 - 10.8 x10E3/uL 4.7  6.2  5.2   RBC 4.14 - 5.80 x10E6/uL 5.04  4.75  4.77   Hemoglobin 13.0 - 17.7 g/dL 15.4  14.5  14.9   Hematocrit 37.5 - 51.0 % 45.3  41.6  43.7   MCV 79 - 97 fL 90  88  92   MCH 26.6 - 33.0 pg 30.6  30.5  31.2   MCHC 31.5 - 35.7 g/dL 34.0  34.9  34.1   RDW 11.6 - 15.4 % 12.1  11.6  12.0   Platelets 150 - 450 x10E3/uL 175  166  143 Low    Neutrophils Not Estab. % 58  67  64   Lymphs Not Estab. % _1 Monocytes Not Estab. % _2 Eos Not Estab. % _3 Basos Not Estab. % _4 Neutrophils Absolute 1.4 - 7.0 x10E3/uL 2.7  4.1  3.3   Lymphocytes Absolute 0.7 - 3.1 x10E3/uL 1.4  1.4  1.2   Monocytes Absolute 0.1 - 0.9 x10E3/uL 0.4  0.5  0.5   EOS (ABSOLUTE) 0.0 - 0.4 x10E3/uL 0.1  0.1  0.1   Basophils Absolute 0.0 - 0.2 x10E3/uL 0.1  0.1  0.1   Immature Granulocytes Not Estab. % 0  1  1   Immature Grans (Abs) 0.0 - 0.1 x10E3/uL 0.0  0.0  0.1   Resulting Agency  [facility]       Narrative Performed by: Maryan Puls Performed at:  [facility]  [address] [place], [place]  [id number]  Lab Director: Rush Farmer MD, Phone:  [phone]    Specimen Collected: 12/13/21 09:00 Last Resulted: 12/14/21 08:13      Lab Flowsheet    Order Details    View Encounter    Lab and Collection Details    Routing    Result History    View Encounter Conversation      CM=Additional comments  R=Reference range differs from displayed range      Result Care Coordination   Patient Communication   Add Comments   Not seen Back to Top       Other Results from 12/13/2021   POCT urinalysis dipstick Order: [id number] Status: Final result    Visible to patient: No (inaccessible in [place])    Next appt: 01/09/2022 at 08:15 AM in [facility] ([facility])    Dx: Routine adult health maintenance    0 Result Notes  Component Ref Range & Units 7 d ago 11 mo ago 1 yr ago  Color, UA  yellow  Yellow  yellow   Clarity, UA  clear  Clear  clear   Glucose, UA Negative Negative  Negative  Negative   Bilirubin, UA  negative  Negative  negative   Ketones, UA  negative  Negatve  negative   Spec Grav, UA 1.010 - 1.025 1.010  1.025  1.015   Blood, UA  negative  Negative  negative   pH, UA 5.0 - 8.0 6.0  6.0  6.0   Protein, UA Negative Negative  Negative  Negative   Urobilinogen, UA 0.2 or 1.0 E.U./dL 0.2  0.2  0.2   Nitrite, UA  negative  Negative  negative   Leukocytes, UA Negative Negative  Negative  Negative   Appearance  light                EKG Sinus  Bradycardia  WITHIN NORMAL LIMITS  ____________________________________________   ____________________________________________   INITIAL IMPRESSION / ASSESSMENT AND PLAN / ED COURSE  As part of my medical decision making, I reviewed the following data within the electronic MEDICAL RECORD NUMBER  Well exam.  Discussed lab results with patient rationale for changing x-ray of the left hand.  Patient given a prescription for Voltaren cream applied 4 times a day as needed for left hand pain.  We will follow-up after obtaining x-ray of the left hand.       ____________________________________________      ED Discharge Orders          Ordered    DICLOFENAC SODIUM 1 % EX GEL  4 times daily        12/20/21 0848    DG Hand Complete Left        12/20/21 0847             Note:  This document was prepared using Dragon voice recognition software and may include unintentional dictation errors.

## 2021-12-20 NOTE — Progress Notes (Signed)
Pt denies any issues or concerns at this time/CL,RMA ?

## 2021-12-25 DIAGNOSIS — M25561 Pain in right knee: Secondary | ICD-10-CM | POA: Diagnosis not present

## 2022-01-09 ENCOUNTER — Other Ambulatory Visit: Payer: Self-pay

## 2022-01-09 ENCOUNTER — Ambulatory Visit: Payer: Self-pay

## 2022-01-09 DIAGNOSIS — Z0110 Encounter for examination of ears and hearing without abnormal findings: Secondary | ICD-10-CM

## 2022-03-01 DIAGNOSIS — M1711 Unilateral primary osteoarthritis, right knee: Secondary | ICD-10-CM | POA: Diagnosis not present

## 2022-03-08 DIAGNOSIS — M1711 Unilateral primary osteoarthritis, right knee: Secondary | ICD-10-CM | POA: Diagnosis not present

## 2022-03-15 DIAGNOSIS — M1711 Unilateral primary osteoarthritis, right knee: Secondary | ICD-10-CM | POA: Diagnosis not present

## 2022-06-13 ENCOUNTER — Other Ambulatory Visit: Payer: Self-pay

## 2022-06-13 MED ORDER — SILDENAFIL CITRATE 100 MG PO TABS: 50.0000 mg | ORAL_TABLET | Freq: Every day | ORAL | 11 refills | Status: DC | PRN

## 2022-11-13 DIAGNOSIS — M1711 Unilateral primary osteoarthritis, right knee: Secondary | ICD-10-CM | POA: Diagnosis not present

## 2022-11-21 DIAGNOSIS — M1711 Unilateral primary osteoarthritis, right knee: Secondary | ICD-10-CM | POA: Diagnosis not present

## 2022-12-24 ENCOUNTER — Ambulatory Visit: Payer: Self-pay

## 2022-12-24 DIAGNOSIS — Z0000 Encounter for general adult medical examination without abnormal findings: Secondary | ICD-10-CM

## 2022-12-24 LAB — POCT URINALYSIS DIPSTICK
Bilirubin, UA: NEGATIVE
Blood, UA: NEGATIVE
Glucose, UA: NEGATIVE
Ketones, UA: NEGATIVE
Leukocytes, UA: NEGATIVE
Nitrite, UA: NEGATIVE
Protein, UA: NEGATIVE
Spec Grav, UA: 1.015 (ref 1.010–1.025)
Urobilinogen, UA: 0.2 E.U./dL
pH, UA: 6 (ref 5.0–8.0)

## 2022-12-24 NOTE — Progress Notes (Signed)
Pt presents today to complete lab portion of sceduled physical./CL,RMA 

## 2022-12-25 LAB — CMP12+LP+TP+TSH+6AC+PSA+CBC…
ALT: 25 IU/L (ref 0–44)
AST: 33 IU/L (ref 0–40)
Albumin/Globulin Ratio: 1.5 (ref 1.2–2.2)
Albumin: 3.9 g/dL (ref 3.9–4.9)
Alkaline Phosphatase: 92 IU/L (ref 44–121)
BUN/Creatinine Ratio: 10 (ref 10–24)
BUN: 9 mg/dL (ref 8–27)
Basophils Absolute: 0.1 10*3/uL (ref 0.0–0.2)
Basos: 2 %
Bilirubin Total: 0.5 mg/dL (ref 0.0–1.2)
Calcium: 9 mg/dL (ref 8.6–10.2)
Chloride: 101 mmol/L (ref 96–106)
Chol/HDL Ratio: 3.3 ratio (ref 0.0–5.0)
Cholesterol, Total: 160 mg/dL (ref 100–199)
Creatinine, Ser: 0.94 mg/dL (ref 0.76–1.27)
EOS (ABSOLUTE): 0.1 10*3/uL (ref 0.0–0.4)
Eos: 2 %
Estimated CHD Risk: <0.5 times avg. (ref 0.0–1.0)
Free Thyroxine Index: 1.8 (ref 1.2–4.9)
GGT: 20 IU/L (ref 0–65)
Globulin, Total: 2.6 g/dL (ref 1.5–4.5)
Glucose: 91 mg/dL (ref 70–99)
HDL: 48 mg/dL (ref >39)
Hematocrit: 37.6 % (ref 37.5–51.0)
Hemoglobin: 12.8 g/dL — ABNORMAL LOW (ref 13.0–17.7)
Immature Grans (Abs): 0 10*3/uL (ref 0.0–0.1)
Immature Granulocytes: 1 %
Iron: 65 ug/dL (ref 38–169)
LDH: 241 IU/L — ABNORMAL HIGH (ref 121–224)
LDL Chol Calc (NIH): 99 mg/dL (ref 0–99)
Lymphocytes Absolute: 3.3 10*3/uL — ABNORMAL HIGH (ref 0.7–3.1)
Lymphs: 52 %
MCH: 30.7 pg (ref 26.6–33.0)
MCHC: 34 g/dL (ref 31.5–35.7)
MCV: 90 fL (ref 79–97)
Monocytes Absolute: 0.6 10*3/uL (ref 0.1–0.9)
Monocytes: 9 %
Neutrophils Absolute: 2.1 10*3/uL (ref 1.4–7.0)
Neutrophils: 34 %
Phosphorus: 3.8 mg/dL (ref 2.8–4.1)
Platelets: 170 10*3/uL (ref 150–450)
Potassium: 4.2 mmol/L (ref 3.5–5.2)
Prostate Specific Ag, Serum: 1.2 ng/mL (ref 0.0–4.0)
RBC: 4.17 x10E6/uL (ref 4.14–5.80)
RDW: 12 % (ref 11.6–15.4)
Sodium: 138 mmol/L (ref 134–144)
T3 Uptake Ratio: 24 % (ref 24–39)
T4, Total: 7.6 ug/dL (ref 4.5–12.0)
TSH: 2.33 u[IU]/mL (ref 0.450–4.500)
Total Protein: 6.5 g/dL (ref 6.0–8.5)
Triglycerides: 67 mg/dL (ref 0–149)
Uric Acid: 5.8 mg/dL (ref 3.8–8.4)
VLDL Cholesterol Cal: 13 mg/dL (ref 5–40)
WBC: 6.1 10*3/uL (ref 3.4–10.8)
eGFR: 90 mL/min/{1.73_m2} (ref >59)

## 2022-12-27 ENCOUNTER — Ambulatory Visit: Payer: Self-pay | Admitting: Physician Assistant

## 2022-12-27 ENCOUNTER — Encounter: Payer: Self-pay | Admitting: Physician Assistant

## 2022-12-27 VITALS — BP 117/72 | HR 90 | Temp 97.5°F | Resp 12 | Ht 73.0 in | Wt 159.0 lb

## 2022-12-27 DIAGNOSIS — Z0000 Encounter for general adult medical examination without abnormal findings: Secondary | ICD-10-CM

## 2022-12-27 DIAGNOSIS — M25562 Pain in left knee: Secondary | ICD-10-CM

## 2022-12-27 MED ORDER — PROMETHAZINE-DM 6.25-15 MG/5ML PO SYRP: 5.0000 mL | ORAL_SOLUTION | Freq: Four times a day (QID) | ORAL | 0 refills | Status: DC | PRN

## 2022-12-27 MED ORDER — LIDOCAINE VISCOUS HCL 2 % MT SOLN: 5.0000 mL | OROMUCOSAL | 0 refills | Status: DC | PRN

## 2022-12-27 NOTE — Progress Notes (Signed)
[facility]  ____________________________________________   None    (approximate)  I have reviewed the triage vital signs and the nursing notes.   HISTORY  Chief Complaint Annual Exam   HPI Gabriel George is a 67 y.o. male patient presents for annual physical exam.  Patient voiced concern for upper and lower joint pain.  Patient also complain of sore throat, postnasal drainage, and cough for 2 weeks.        History reviewed. No pertinent past medical history.  Patient Active Problem List   Diagnosis Date Noted   Fracture of phalanx of finger 12/12/2017    History reviewed. No pertinent surgical history.  Prior to Admission medications   Medication Sig Start Date End Date Taking? Authorizing Provider  sildenafil (VIAGRA) 100 MG tablet Take 0.5-1 tablets (50-100 mg total) by mouth daily as needed for erectile dysfunction. 06/13/22  Yes Sable Feil, PA-C    Allergies Patient has no known allergies.  History reviewed. No pertinent family history.  Social History Social History   Tobacco Use   Smoking status: Never   Smokeless tobacco: Current    Types: Chew  Substance Use Topics   Alcohol use: Yes    Review of Systems Constitutional: No fever/chills Eyes: No visual changes. ENT: No sore throat. Cardiovascular: Denies chest pain. Respiratory: Denies shortness of breath. Gastrointestinal: No abdominal pain.  No nausea, no vomiting.  No diarrhea.  No constipation. Genitourinary: Negative for dysuria.  Erectile dysfunction Musculoskeletal: Negative for back pain.  Joint pain  skin: Negative for rash. Neurological: Negative for headaches, focal weakness or numbness.   ____________________________________________   PHYSICAL EXAM:  VITAL SIGNS: BP is 117/72, pulse 90, respiration 12, temperature 97.5, and patient is 1% O2 sat on room air.  Patient with 159 pounds BMI is 20.98. Constitutional: Alert and oriented.  Well appearing and in no acute distress. Eyes: Conjunctivae are normal. PERRL. EOMI. Head: Atraumatic. Nose: No congestion/rhinnorhea. Mouth/Throat: Mucous membranes are moist.  Oropharynx non-erythematous. Neck: No stridor.  No cervical spine tenderness to palpation. Hematological/Lymphatic/Immunilogical: No cervical lymphadenopathy. Cardiovascular: Normal rate, regular rhythm. Grossly normal heart sounds.  Good peripheral circulation. Respiratory: Normal respiratory effort.  No retractions. Lungs CTAB. Gastrointestinal: Soft and nontender. No distention. No abdominal bruits. No CVA tenderness. Genitourinary: Deferred Musculoskeletal: No lower extremity tenderness nor edema.  No joint effusions. Neurologic:  Normal speech and language. No gross focal neurologic deficits are appreciated. No gait instability. Skin:  Skin is warm, dry and intact. No rash noted. Psychiatric: Mood and affect are normal. Speech and behavior are normal.  ____________________________________________   LABS         Component Ref Range & Units 3 d ago (12/24/22) 1 yr ago (12/13/21) 1 yr ago (12/28/20) 3 yr ago (12/23/19)  Color, UA  yellow yellow Yellow yellow  Clarity, UA  clear clear Clear clear  Glucose, UA Negative Negative Negative Negative Negative  Bilirubin, UA  neg negative Negative negative  Ketones, UA  neg negative Negatve negative  Spec Grav, UA 1.010 - 1.025 1.015 1.010 1.025 1.015  Blood, UA  neg negative Negative negative  pH, UA 5.0 - 8.0 6.0 6.0 6.0 6.0  Protein, UA Negative Negative Negative Negative Negative  Urobilinogen, UA 0.2 or 1.0 E.U./dL 0.2 0.2 0.2 0.2  Nitrite, UA  neg negative Negative negative  Leukocytes, UA Negative Negative Negative Negative Negative  Appearance   light    Odor  Specimen Collected: 12/24/22 09:18 Last Resulted: 12/24/22 09:18      Lab Flowsheet      Order Details      View Encounter      Lab and Collection Details      Routing       Result History    View All Conversations on this Encounter        Result Care Coordination   Patient Communication   Add Comments   Not seen Back to Top      Other Results from 12/24/2022   Contains abnormal data CMP12+LP+TP+TSH+6AC+PSA+CBC. Order: [id number] Status: Final result      Visible to patient: No (inaccessible in MyChart)      Next appt: None      Dx: Routine adult health maintenance    0 Result Notes           Component Ref Range & Units 3 d ago (12/24/22) 1 yr ago (12/13/21) 1 yr ago (12/28/20) 3 yr ago (12/23/19)  Glucose 70 - 99 mg/dL 91 92 89 R 93 R  Uric Acid 3.8 - 8.4 mg/dL 5.8 5.9 CM 6.0 CM 5.9 CM  Comment:            Therapeutic target for gout patients: <6.0  BUN 8 - 27 mg/dL 9 14 12 11   Creatinine, Ser 0.76 - 1.27 mg/dL 0.94 1.06 0.94 0.93  eGFR >59 mL/min/1.73 90 78    BUN/Creatinine Ratio 10 - 24 10 13 13 12   Sodium 134 - 144 mmol/L 138 136 140 139  Potassium 3.5 - 5.2 mmol/L 4.2 4.6 4.6 5.1  Chloride 96 - 106 mmol/L 101 98 102 101  Calcium 8.6 - 10.2 mg/dL 9.0 9.4 9.7 9.4  Phosphorus 2.8 - 4.1 mg/dL 3.8 3.3 3.5 3.8  Total Protein 6.0 - 8.5 g/dL 6.5 7.1 6.9 6.9  Albumin 3.9 - 4.9 g/dL 3.9 4.6 R 4.4 R 4.5 R  Globulin, Total 1.5 - 4.5 g/dL 2.6 2.5 2.5 2.4  Albumin/Globulin Ratio 1.2 - 2.2 1.5 1.8 1.8 1.9  Bilirubin Total 0.0 - 1.2 mg/dL 0.5 0.9 0.6 0.4  Alkaline Phosphatase 44 - 121 IU/L 92 72 79 65 R  LDH 121 - 224 IU/L 241 High  176 180 241 High   AST 0 - 40 IU/L 33 24 25 28   ALT 0 - 44 IU/L 25 19 22 17   GGT 0 - 65 IU/L 20 16 20 20   Iron 38 - 169 ug/dL 65 159 115 69  Cholesterol, Total 100 - 199 mg/dL 160 226 High  207 High  195  Triglycerides 0 - 149 mg/dL 67 72 65 46  HDL >39 mg/dL 48 88 66 90  VLDL Cholesterol Cal 5 - 40 mg/dL 13 12 12 9   LDL Chol Calc (NIH) 0 - 99 mg/dL 99 126 High  129 High  96  Chol/HDL Ratio 0.0 - 5.0 ratio 3.3 2.6 CM 3.1 CM 2.2 CM  Comment:                                   T. Chol/HDL Ratio                                              Men  Women  1/2 Avg.Risk  3.4    3.3                                   Avg.Risk  5.0    4.4                                2X Avg.Risk  9.6    7.1                                3X Avg.Risk 23.4   11.0  Estimated CHD Risk 0.0 - 1.0 times avg.  < 0.5  < 0.5 CM  < 0.5 CM  < 0.5 CM  Comment: The CHD Risk is based on the T. Chol/HDL ratio. Other factors affect CHD Risk such as hypertension, smoking, diabetes, severe obesity, and family history of premature CHD.  TSH 0.450 - 4.500 uIU/mL 2.330 2.630 1.430 1.870  T4, Total 4.5 - 12.0 ug/dL 7.6 7.8 8.0 6.9  T3 Uptake Ratio 24 - 39 % 24 24 27 24   Free Thyroxine Index 1.2 - 4.9 1.8 1.9 2.2 1.7  Prostate Specific Ag, Serum 0.0 - 4.0 ng/mL 1.2 2.2 CM 2.5 CM 2.7 CM  Comment: Roche ECLIA methodology. According to the American Urological Association, Serum PSA should decrease and remain at undetectable levels after radical prostatectomy. The AUA defines biochemical recurrence as an initial PSA value 0.2 ng/mL or greater followed by a subsequent confirmatory PSA value 0.2 ng/mL or greater. Values obtained with different assay methods or kits cannot be used interchangeably. Results cannot be interpreted as absolute evidence of the presence or absence of malignant disease.  WBC 3.4 - 10.8 x10E3/uL 6.1 4.7 6.2 5.2  RBC 4.14 - 5.80 x10E6/uL 4.17 5.04 4.75 4.77  Hemoglobin 13.0 - 17.7 g/dL 12.8 Low  15.4 14.5 14.9  Hematocrit 37.5 - 51.0 % 37.6 45.3 41.6 43.7  MCV 79 - 97 fL 90 90 88 92  MCH 26.6 - 33.0 pg 30.7 30.6 30.5 31.2  MCHC 31.5 - 35.7 g/dL 34.0 34.0 34.9 34.1  RDW 11.6 - 15.4 % 12.0 12.1 11.6 12.0  Platelets 150 - 450 x10E3/uL 170 175 166 143 Low   Neutrophils Not Estab. % 34 58 67 64  Lymphs Not Estab. % 52 29 23 23   Monocytes Not Estab. % 9 9 7 9   Eos Not Estab. % 2 2 1 2   Basos Not Estab. % 2 2 1 1   Neutrophils Absolute 1.4 - 7.0 x10E3/uL 2.1 2.7 4.1 3.3  Lymphocytes Absolute  0.7 - 3.1 x10E3/uL 3.3 High  1.4 1.4 1.2  Monocytes Absolute 0.1 - 0.9 x10E3/uL 0.6 0.4 0.5 0.5  EOS (ABSOLUTE) 0.0 - 0.4 x10E3/uL 0.1 0.1 0.1 0.1  Basophils Absolute 0.0 - 0.2 x10E3/uL 0.1 0.1 0.1 0.1  Immature Granulocytes Not Estab. % 1 0 1 1              ____________________________________________  EKG  Normal sinus rhythm at 64 bpm ____________________________________________    ____________________________________________   INITIAL IMPRESSION / ASSESSMENT AND PLAN  As part of my medical decision making, I reviewed the following data within the electronic MEDICAL RECORD NUMBER         Discussed no acute findings on physical exam and lab results with patient.  Patient will follow-up  status post lab results of ANA/RA/CRP with joint pain.  Patient given a prescription for Phenergan DM to mix with viscous lidocaine for swish and swallow.     ____________________________________________   FINAL CLINICAL IMPRESSION Well exam   ED Discharge Orders     None        Note:  This document was prepared using Dragon voice recognition software and may include unintentional dictation errors.

## 2022-12-28 LAB — C-REACTIVE PROTEIN: CRP: 5 mg/L (ref 0–10)

## 2022-12-28 LAB — ANA W/REFLEX IF POSITIVE: Anti Nuclear Antibody (ANA): NEGATIVE

## 2022-12-28 LAB — SEDIMENTATION RATE: Sed Rate: 5 mm/hr (ref 0–30)

## 2023-06-24 IMAGING — CR DG HAND COMPLETE 3+V*L*
1 series · 3 of 3 positions shown · non-contrast
Comparison: None.

CLINICAL DATA: Chronic pain over the left second MCP joint.

EXAM:
LEFT HAND - COMPLETE 3+ VIEW

[Series 1: dg hand complete left · 0.14mm/px · 3 of 3 slices shown]
[im 1/3]
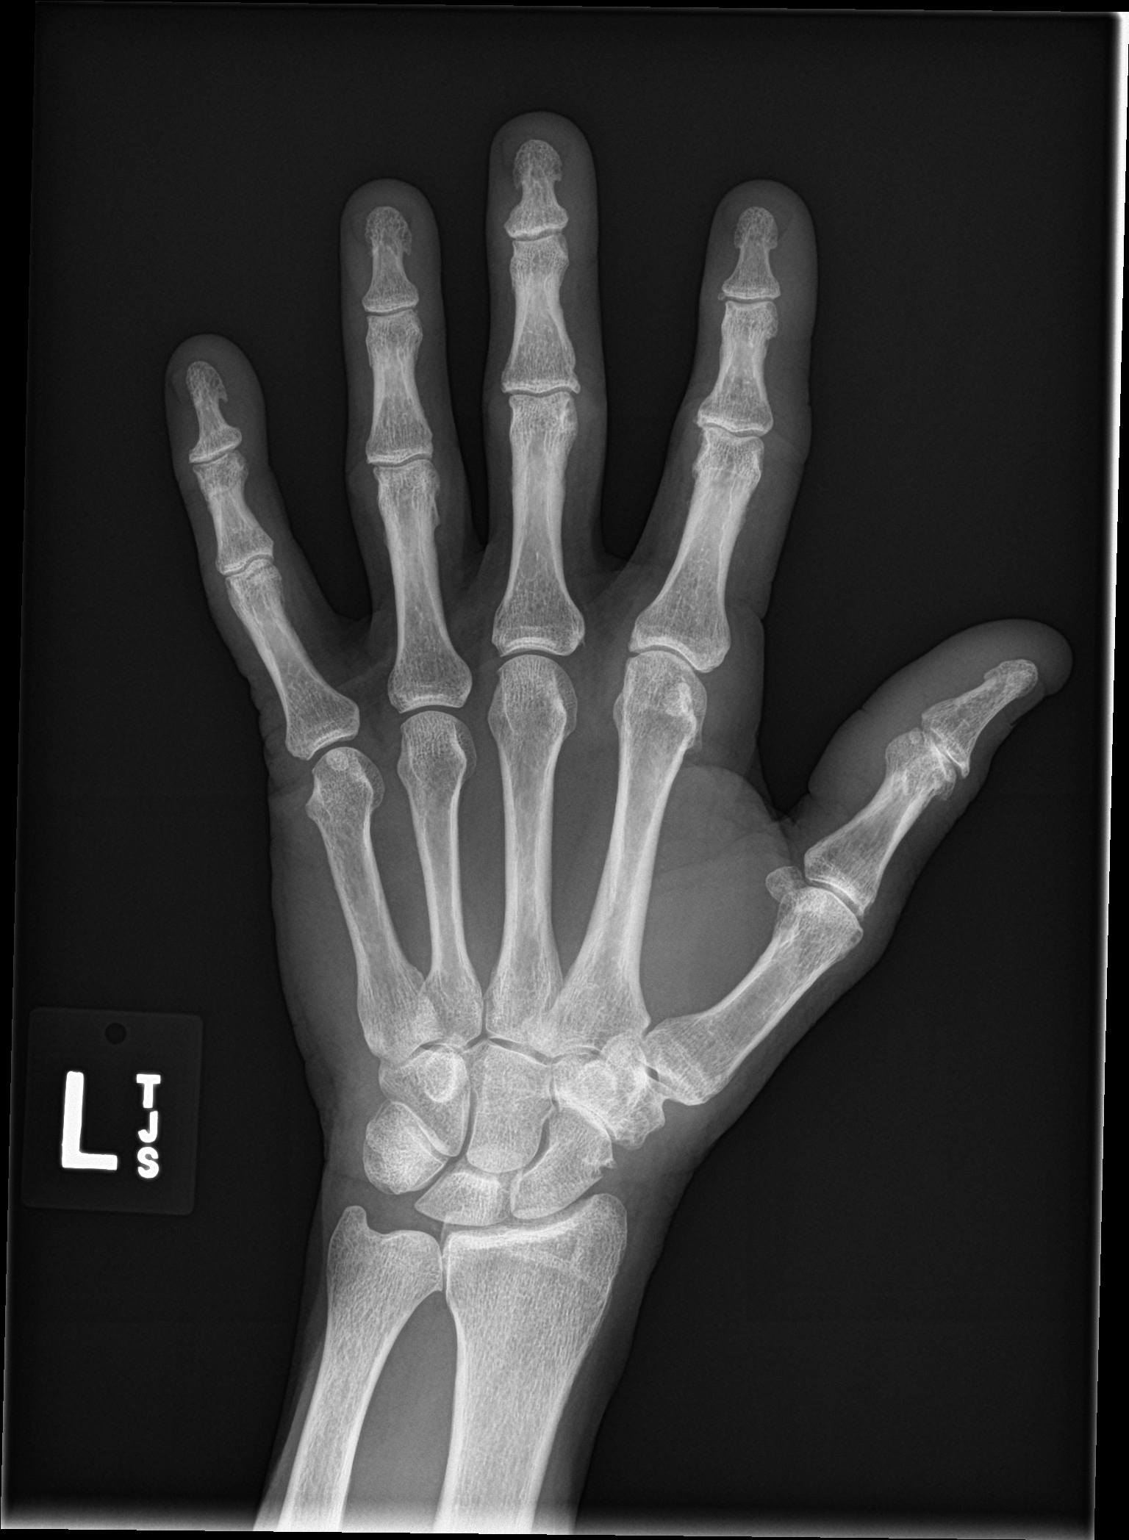
[im 2/3]
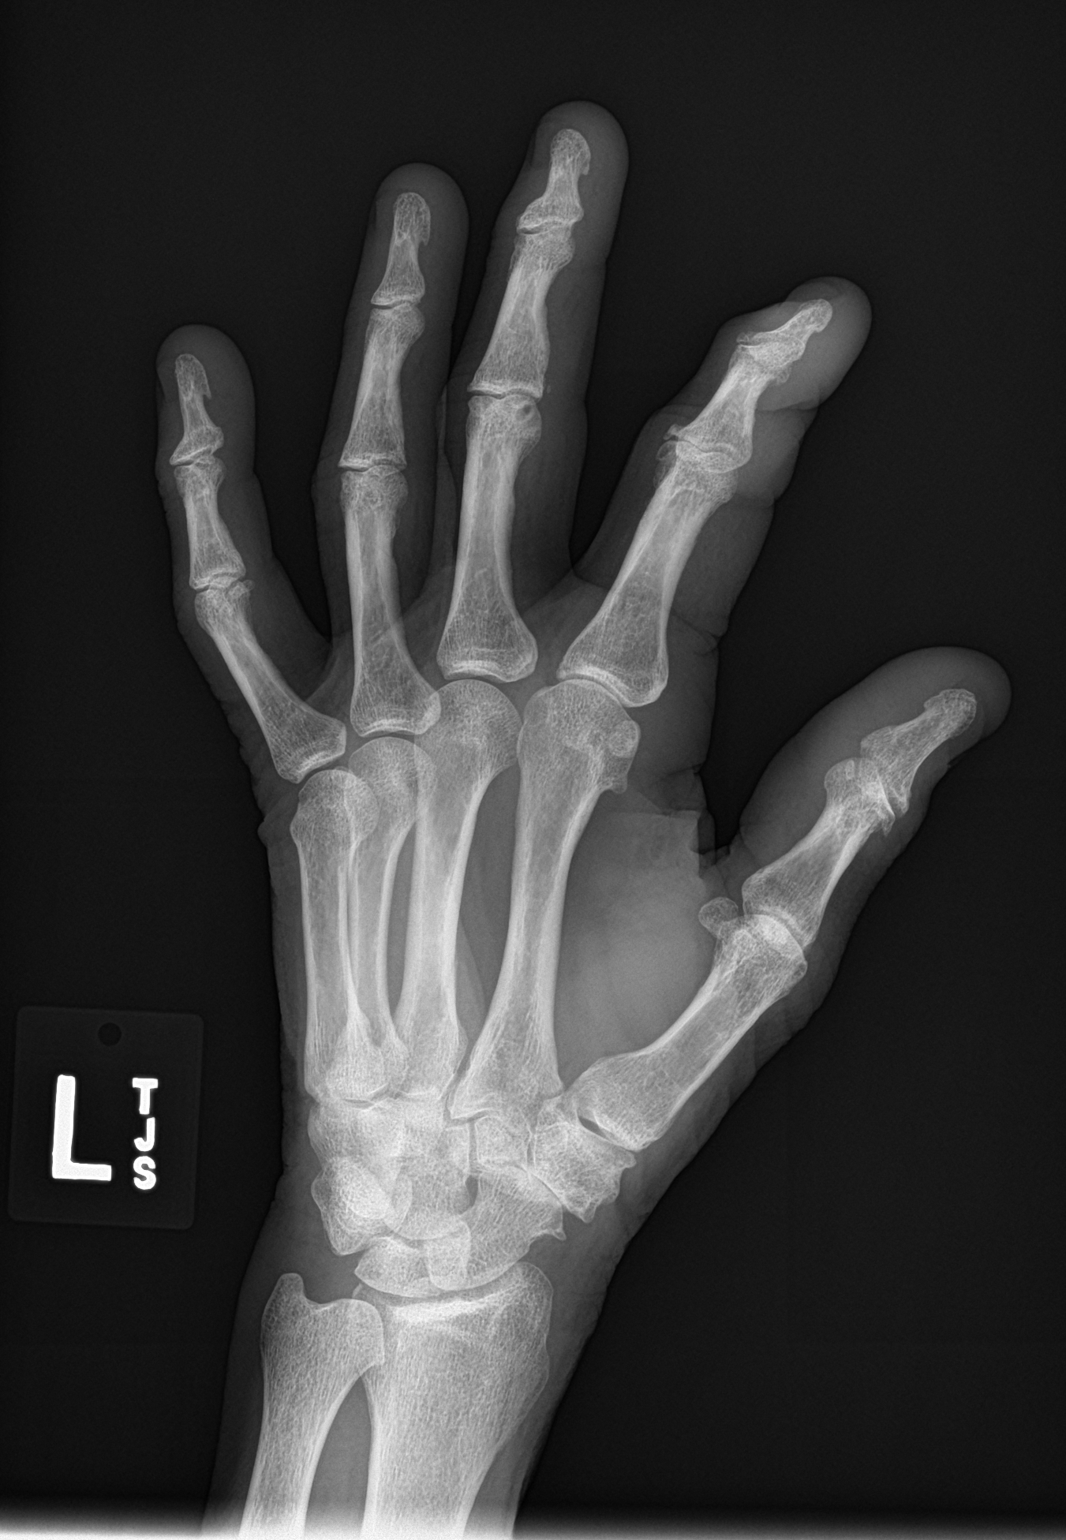
[im 3/3]
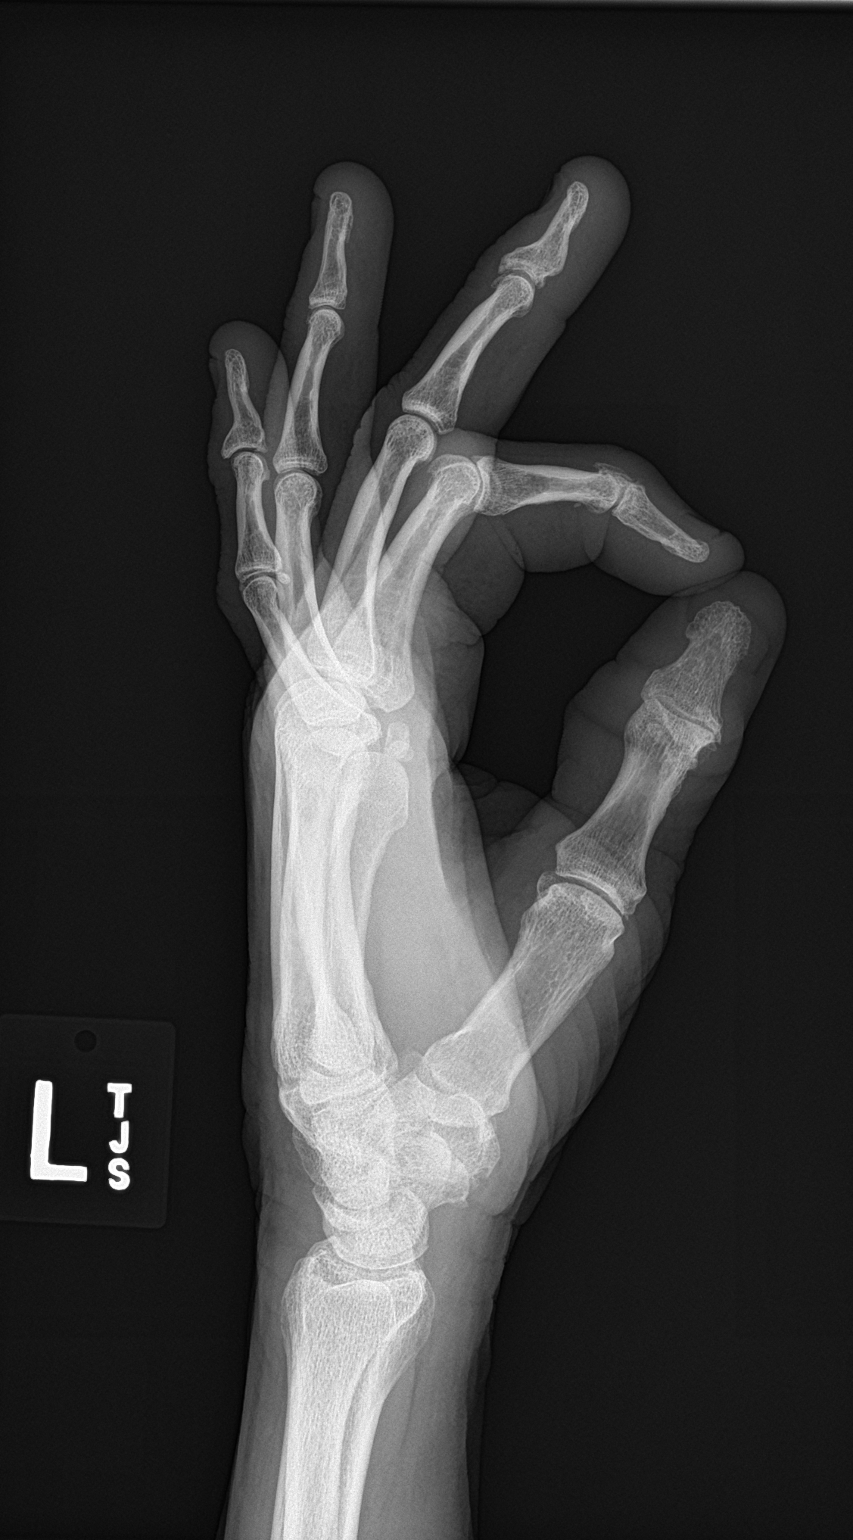

[3 of 3 positions shown; findings below may reference images not displayed]

FINDINGS: There is no acute fracture or dislocation. There is arthritic
changes involving the interphalangeal joint of the thumb as well as
PIP and DIP joints of the index finger with spurring. There is
arthritic changes and spurring of the distal interphalangeal joint
of the third digit. Mild narrowing of the second metacarpophalangeal
joint. The soft tissues are unremarkable.
IMPRESSION: 1. No acute fracture or dislocation.
2. Mild arthritic changes of the above.

## 2023-07-11 DIAGNOSIS — M1711 Unilateral primary osteoarthritis, right knee: Secondary | ICD-10-CM | POA: Diagnosis not present

## 2023-09-17 DIAGNOSIS — M1711 Unilateral primary osteoarthritis, right knee: Secondary | ICD-10-CM | POA: Diagnosis not present

## 2023-09-17 DIAGNOSIS — M25561 Pain in right knee: Secondary | ICD-10-CM | POA: Diagnosis not present

## 2023-10-04 DIAGNOSIS — M1711 Unilateral primary osteoarthritis, right knee: Secondary | ICD-10-CM | POA: Diagnosis not present

## 2023-10-04 DIAGNOSIS — M25761 Osteophyte, right knee: Secondary | ICD-10-CM | POA: Diagnosis not present

## 2023-10-04 DIAGNOSIS — G8918 Other acute postprocedural pain: Secondary | ICD-10-CM | POA: Diagnosis not present

## 2023-10-07 DIAGNOSIS — M25561 Pain in right knee: Secondary | ICD-10-CM | POA: Diagnosis not present

## 2023-10-07 DIAGNOSIS — R2689 Other abnormalities of gait and mobility: Secondary | ICD-10-CM | POA: Diagnosis not present

## 2023-10-10 DIAGNOSIS — R2689 Other abnormalities of gait and mobility: Secondary | ICD-10-CM | POA: Diagnosis not present

## 2023-10-10 DIAGNOSIS — M25561 Pain in right knee: Secondary | ICD-10-CM | POA: Diagnosis not present

## 2023-10-15 DIAGNOSIS — R2689 Other abnormalities of gait and mobility: Secondary | ICD-10-CM | POA: Diagnosis not present

## 2023-10-15 DIAGNOSIS — M25561 Pain in right knee: Secondary | ICD-10-CM | POA: Diagnosis not present

## 2023-10-18 DIAGNOSIS — R2689 Other abnormalities of gait and mobility: Secondary | ICD-10-CM | POA: Diagnosis not present

## 2023-10-18 DIAGNOSIS — M25561 Pain in right knee: Secondary | ICD-10-CM | POA: Diagnosis not present

## 2023-10-22 DIAGNOSIS — M25561 Pain in right knee: Secondary | ICD-10-CM | POA: Diagnosis not present

## 2023-10-22 DIAGNOSIS — R2689 Other abnormalities of gait and mobility: Secondary | ICD-10-CM | POA: Diagnosis not present

## 2023-10-25 DIAGNOSIS — R2689 Other abnormalities of gait and mobility: Secondary | ICD-10-CM | POA: Diagnosis not present

## 2023-10-25 DIAGNOSIS — M25561 Pain in right knee: Secondary | ICD-10-CM | POA: Diagnosis not present

## 2023-10-29 DIAGNOSIS — R2689 Other abnormalities of gait and mobility: Secondary | ICD-10-CM | POA: Diagnosis not present

## 2023-10-29 DIAGNOSIS — M25561 Pain in right knee: Secondary | ICD-10-CM | POA: Diagnosis not present

## 2023-11-01 DIAGNOSIS — R2689 Other abnormalities of gait and mobility: Secondary | ICD-10-CM | POA: Diagnosis not present

## 2023-11-01 DIAGNOSIS — M25561 Pain in right knee: Secondary | ICD-10-CM | POA: Diagnosis not present

## 2023-11-05 DIAGNOSIS — M25561 Pain in right knee: Secondary | ICD-10-CM | POA: Diagnosis not present

## 2023-11-05 DIAGNOSIS — R2689 Other abnormalities of gait and mobility: Secondary | ICD-10-CM | POA: Diagnosis not present

## 2023-11-08 DIAGNOSIS — R2689 Other abnormalities of gait and mobility: Secondary | ICD-10-CM | POA: Diagnosis not present

## 2023-11-08 DIAGNOSIS — M25561 Pain in right knee: Secondary | ICD-10-CM | POA: Diagnosis not present

## 2023-11-12 DIAGNOSIS — M25561 Pain in right knee: Secondary | ICD-10-CM | POA: Diagnosis not present

## 2023-11-12 DIAGNOSIS — R2689 Other abnormalities of gait and mobility: Secondary | ICD-10-CM | POA: Diagnosis not present

## 2023-11-18 DIAGNOSIS — M25561 Pain in right knee: Secondary | ICD-10-CM | POA: Diagnosis not present

## 2023-11-18 DIAGNOSIS — R2689 Other abnormalities of gait and mobility: Secondary | ICD-10-CM | POA: Diagnosis not present

## 2023-11-26 DIAGNOSIS — R2689 Other abnormalities of gait and mobility: Secondary | ICD-10-CM | POA: Diagnosis not present

## 2023-11-26 DIAGNOSIS — M25561 Pain in right knee: Secondary | ICD-10-CM | POA: Diagnosis not present

## 2023-12-02 DIAGNOSIS — M25561 Pain in right knee: Secondary | ICD-10-CM | POA: Diagnosis not present

## 2023-12-02 DIAGNOSIS — R2689 Other abnormalities of gait and mobility: Secondary | ICD-10-CM | POA: Diagnosis not present

## 2023-12-09 DIAGNOSIS — M25561 Pain in right knee: Secondary | ICD-10-CM | POA: Diagnosis not present

## 2023-12-09 DIAGNOSIS — R2689 Other abnormalities of gait and mobility: Secondary | ICD-10-CM | POA: Diagnosis not present

## 2023-12-16 DIAGNOSIS — R2689 Other abnormalities of gait and mobility: Secondary | ICD-10-CM | POA: Diagnosis not present

## 2023-12-16 DIAGNOSIS — M25561 Pain in right knee: Secondary | ICD-10-CM | POA: Diagnosis not present

## 2023-12-23 DIAGNOSIS — M25561 Pain in right knee: Secondary | ICD-10-CM | POA: Diagnosis not present

## 2023-12-23 DIAGNOSIS — R2689 Other abnormalities of gait and mobility: Secondary | ICD-10-CM | POA: Diagnosis not present

## 2023-12-24 DIAGNOSIS — M1711 Unilateral primary osteoarthritis, right knee: Secondary | ICD-10-CM | POA: Diagnosis not present

## 2023-12-26 ENCOUNTER — Ambulatory Visit: Payer: Self-pay

## 2023-12-26 DIAGNOSIS — Z0000 Encounter for general adult medical examination without abnormal findings: Secondary | ICD-10-CM

## 2023-12-26 LAB — POCT URINALYSIS DIPSTICK
Bilirubin, UA: NEGATIVE
Blood, UA: NEGATIVE
Glucose, UA: NEGATIVE
Ketones, UA: NEGATIVE
Leukocytes, UA: NEGATIVE
Nitrite, UA: NEGATIVE
Protein, UA: NEGATIVE
Spec Grav, UA: 1.01 (ref 1.010–1.025)
Urobilinogen, UA: 0.2 U/dL
pH, UA: 6 (ref 5.0–8.0)

## 2023-12-26 NOTE — Progress Notes (Signed)
Pt completed labs for physical. Gabriel George

## 2023-12-27 LAB — CMP12+LP+TP+TSH+6AC+PSA+CBC…
ALT: 16 [IU]/L (ref 0–44)
AST: 25 [IU]/L (ref 0–40)
Albumin: 4.2 g/dL (ref 3.9–4.9)
Alkaline Phosphatase: 81 [IU]/L (ref 44–121)
BUN/Creatinine Ratio: 12 (ref 10–24)
BUN: 10 mg/dL (ref 8–27)
Basophils Absolute: 0 10*3/uL (ref 0.0–0.2)
Basos: 1 %
Bilirubin Total: 0.4 mg/dL (ref 0.0–1.2)
Calcium: 9.1 mg/dL (ref 8.6–10.2)
Chloride: 102 mmol/L (ref 96–106)
Chol/HDL Ratio: 2.7 {ratio} (ref 0.0–5.0)
Cholesterol, Total: 186 mg/dL (ref 100–199)
Creatinine, Ser: 0.83 mg/dL (ref 0.76–1.27)
EOS (ABSOLUTE): 0.1 10*3/uL (ref 0.0–0.4)
Eos: 2 %
Estimated CHD Risk: <0.5 times avg. (ref 0.0–1.0)
Free Thyroxine Index: 1.9 (ref 1.2–4.9)
GGT: 18 [IU]/L (ref 0–65)
Globulin, Total: 2.7 g/dL (ref 1.5–4.5)
Glucose: 87 mg/dL (ref 70–99)
HDL: 68 mg/dL (ref >39)
Hematocrit: 41.8 % (ref 37.5–51.0)
Hemoglobin: 13.7 g/dL (ref 13.0–17.7)
Immature Grans (Abs): 0 10*3/uL (ref 0.0–0.1)
Immature Granulocytes: 0 %
Iron: 39 ug/dL (ref 38–169)
LDH: 161 [IU]/L (ref 121–224)
LDL Chol Calc (NIH): 108 mg/dL — ABNORMAL HIGH (ref 0–99)
Lymphocytes Absolute: 1.9 10*3/uL (ref 0.7–3.1)
Lymphs: 49 %
MCH: 29.2 pg (ref 26.6–33.0)
MCHC: 32.8 g/dL (ref 31.5–35.7)
MCV: 89 fL (ref 79–97)
Monocytes Absolute: 0.4 10*3/uL (ref 0.1–0.9)
Monocytes: 11 %
Neutrophils Absolute: 1.4 10*3/uL (ref 1.4–7.0)
Neutrophils: 37 %
Phosphorus: 3.4 mg/dL (ref 2.8–4.1)
Platelets: 159 10*3/uL (ref 150–450)
Potassium: 4 mmol/L (ref 3.5–5.2)
Prostate Specific Ag, Serum: 1.7 ng/mL (ref 0.0–4.0)
RBC: 4.69 x10E6/uL (ref 4.14–5.80)
RDW: 13.2 % (ref 11.6–15.4)
Sodium: 140 mmol/L (ref 134–144)
T3 Uptake Ratio: 27 % (ref 24–39)
T4, Total: 6.9 ug/dL (ref 4.5–12.0)
TSH: 1.51 u[IU]/mL (ref 0.450–4.500)
Total Protein: 6.9 g/dL (ref 6.0–8.5)
Triglycerides: 53 mg/dL (ref 0–149)
Uric Acid: 6 mg/dL (ref 3.8–8.4)
VLDL Cholesterol Cal: 10 mg/dL (ref 5–40)
WBC: 3.8 10*3/uL (ref 3.4–10.8)
eGFR: 97 mL/min/{1.73_m2} (ref >59)

## 2023-12-30 DIAGNOSIS — M25561 Pain in right knee: Secondary | ICD-10-CM | POA: Diagnosis not present

## 2023-12-30 DIAGNOSIS — R2689 Other abnormalities of gait and mobility: Secondary | ICD-10-CM | POA: Diagnosis not present

## 2024-01-02 ENCOUNTER — Encounter: Payer: 59 | Admitting: Physician Assistant

## 2024-01-07 DIAGNOSIS — M25561 Pain in right knee: Secondary | ICD-10-CM | POA: Diagnosis not present

## 2024-01-07 DIAGNOSIS — R2689 Other abnormalities of gait and mobility: Secondary | ICD-10-CM | POA: Diagnosis not present

## 2024-01-08 ENCOUNTER — Encounter: Payer: Self-pay | Admitting: Physician Assistant

## 2024-01-08 ENCOUNTER — Ambulatory Visit: Payer: Self-pay | Admitting: Physician Assistant

## 2024-01-08 VITALS — BP 117/73 | HR 81 | Temp 98.0°F | Resp 16 | Ht 73.0 in | Wt 160.8 lb

## 2024-01-08 DIAGNOSIS — Z0000 Encounter for general adult medical examination without abnormal findings: Secondary | ICD-10-CM

## 2024-01-08 NOTE — Progress Notes (Signed)
Here for yearly physical with [facility] and denies any complaints.  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

## 2024-01-08 NOTE — Progress Notes (Signed)
[facility]  ____________________________________________   None    (approximate)  I have reviewed the triage vital signs and the nursing notes.   HISTORY  Chief Complaint No chief complaint on file.   HPI Gabriel George is a 68 y.o. male patient reports for annual physical exam.  Voices no concerns or complaints.  Status post several weeks right knee surgery        No past medical history on file.  Patient Active Problem List   Diagnosis Date Noted   Fracture of phalanx of finger 12/12/2017    No past surgical history on file.  Prior to Admission medications   Medication Sig Start Date End Date Taking? Authorizing Provider  sildenafil (VIAGRA) 100 MG tablet Take 0.5-1 tablets (50-100 mg total) by mouth daily as needed for erectile dysfunction. Patient not taking: Reported on 01/08/2024 06/13/22   Joni Reining, PA-C    Allergies Patient has no known allergies.  No family history on file.  Social History Social History   Tobacco Use   Smoking status: Never   Smokeless tobacco: Current    Types: Chew  Substance Use Topics   Alcohol use: Yes    Review of Systems Constitutional: No fever/chills Eyes: No visual changes. ENT: No sore throat. Cardiovascular: Denies chest pain. Respiratory: Denies shortness of breath. Gastrointestinal: No abdominal pain.  No nausea, no vomiting.  No diarrhea.  No constipation. Genitourinary: Negative for dysuria. Musculoskeletal: Negative for back pain. Skin: Negative for rash. Neurological: Negative for headaches, focal weakness or numbness.   ____________________________________________   PHYSICAL EXAM:  VITAL SIGNS: BP 117/73  Pulse Rate 81  Temp 98 F (36.7 C)  Weight 160 lb 12.8 oz (72.9 kg)  Height 6\' 1"  (1.854 m)  Resp 16  SpO2 99 %   BMI: 21.22 kg/m2  BSA: 1.94 m2   Constitutional: Alert and oriented. Well appearing and in no acute distress. Eyes:  Conjunctivae are normal. PERRL. EOMI. Head: Atraumatic. Nose: No congestion/rhinnorhea. Mouth/Throat: Mucous membranes are moist.  Oropharynx non-erythematous. Neck: No stridor.  No cervical spine tenderness to palpation. Hematological/Lymphatic/Immunilogical: No cervical lymphadenopathy. Cardiovascular: Normal rate, regular rhythm. Grossly normal heart sounds.  Good peripheral circulation. Respiratory: Normal respiratory effort.  No retractions. Lungs CTAB. Gastrointestinal: Soft and nontender. No distention. No abdominal bruits. No CVA tenderness. Genitourinary: Deferred Musculoskeletal: No lower extremity tenderness nor edema.  No joint effusions. Neurologic:  Normal speech and language. No gross focal neurologic deficits are appreciated. No gait instability. Skin:  Skin is warm, dry and intact. No rash noted. Psychiatric: Mood and affect are normal. Speech and behavior are normal.  ____________________________________________   LABS _        Component Ref Range & Units (hover) 13 d ago 1 yr ago 2 yr ago 3 yr ago 4 yr ago  Color, UA yellow yellow yellow Yellow yellow  Clarity, UA clear clear clear Clear clear  Glucose, UA Negative Negative Negative Negative Negative  Bilirubin, UA neg neg negative Negative negative  Ketones, UA neg neg negative Negatve negative  Spec Grav, UA 1.010 1.015 1.010 1.025 1.015  Blood, UA neg neg negative Negative negative  pH, UA 6.0 6.0 6.0 6.0 6.0  Protein, UA Negative Negative Negative Negative Negative  Urobilinogen, UA 0.2 0.2 0.2 0.2 0.2  Nitrite, UA neg neg negative Negative negative  Leukocytes, UA Negative Negative Negative Negative Negative  Appearance   light    Odor  View All Conversations on this Encounter             Component Ref Range & Units (hover) 13 d ago 1 yr ago 2 yr ago 3 yr ago 4 yr ago  Glucose 87 91 92 89 R 93 R  Uric Acid 6.0 5.8 CM 5.9 CM 6.0 CM 5.9 CM  Comment:            Therapeutic target  for gout patients: <6.0  BUN 10 9 14 12 11   Creatinine, Ser 0.83 0.94 1.06 0.94 0.93  eGFR 97 90 78    BUN/Creatinine Ratio 12 10 13 13 12   Sodium 140 138 136 140 139  Potassium 4.0 4.2 4.6 4.6 5.1  Chloride 102 101 98 102 101  Calcium 9.1 9.0 9.4 9.7 9.4  Phosphorus 3.4 3.8 3.3 3.5 3.8  Total Protein 6.9 6.5 7.1 6.9 6.9  Albumin 4.2 3.9 4.6 R 4.4 R 4.5 R  Globulin, Total 2.7 2.6 2.5 2.5 2.4  Bilirubin Total 0.4 0.5 0.9 0.6 0.4  Alkaline Phosphatase 81 92 72 79 65 R  LDH 161 241 High  176 180 241 High   AST 25 33 24 25 28   ALT 16 25 19 22 17   GGT 18 20 16 20 20   Iron 39 65 159 115 69  Cholesterol, Total 186 160 226 High  207 High  195  Triglycerides 53 67 72 65 46  HDL 68 48 88 66 90  VLDL Cholesterol Cal 10 13 12 12 9   LDL Chol Calc (NIH) 108 High  99 126 High  129 High  96  Chol/HDL Ratio 2.7 3.3 CM 2.6 CM 3.1 CM 2.2 CM  Comment:                                   T. Chol/HDL Ratio                                             Men  Women                               1/2 Avg.Risk  3.4    3.3                                   Avg.Risk  5.0    4.4                                2X Avg.Risk  9.6    7.1                                3X Avg.Risk 23.4   11.0  Estimated CHD Risk  < 0.5  < 0.5 CM  < 0.5 CM  < 0.5 CM  < 0.5 CM  Comment: The CHD Risk is based on the T. Chol/HDL ratio. Other factors affect CHD Risk such as hypertension, smoking, diabetes, severe obesity, and family history of premature CHD.  TSH 1.510 2.330 2.630 1.430 1.870  T4, Total 6.9 7.6 7.8 8.0 6.9  T3 Uptake Ratio 27 24 24 27 24   Free Thyroxine Index 1.9 1.8 1.9 2.2 1.7  Prostate Specific Ag, Serum 1.7 1.2 CM 2.2 CM 2.5 CM 2.7 CM  Comment: Roche ECLIA methodology. According to the American Urological Association, Serum PSA should decrease and remain at undetectable levels after radical prostatectomy. The AUA defines biochemical recurrence as an initial PSA value 0.2 ng/mL or greater followed by a subsequent  confirmatory PSA value 0.2 ng/mL or greater. Values obtained with different assay methods or kits cannot be used interchangeably. Results cannot be interpreted as absolute evidence of the presence or absence of malignant disease.  WBC 3.8 6.1 4.7 6.2 5.2  RBC 4.69 4.17 5.04 4.75 4.77  Hemoglobin 13.7 12.8 Low  15.4 14.5 14.9  Hematocrit 41.8 37.6 45.3 41.6 43.7  MCV 89 90 90 88 92  MCH 29.2 30.7 30.6 30.5 31.2  MCHC 32.8 34.0 34.0 34.9 34.1  RDW 13.2 12.0 12.1 11.6 12.0  Platelets 159 170 175 166 143 Low   Neutrophils 37 34 58 67 64  Lymphs 49 52 29 23 23   Monocytes 11 9 9 7 9   Eos 2 2 2 1 2   Basos 1 2 2 1 1   Neutrophils Absolute 1.4 2.1 2.7 4.1 3.3  Lymphocytes Absolute 1.9 3.3 High  1.4 1.4 1.2  Monocytes Absolute 0.4 0.6 0.4 0.5 0.5  EOS (ABSOLUTE) 0.1 0.1 0.1 0.1 0.1  Basophils Absolute 0.0 0.1 0.1 0.1 0.1  Immature Granulocytes 0 1 0 1 1  Immature Grans (Abs) 0.0 0.0 0.0 0.0 0.1           ___________________________________________  EKG  Sinus rhythm at 67 bpm ____________________________________________    ____________________________________________   INITIAL IMPRESSION / ASSESSMENT AND PLAN   As part of my medical decision making, I reviewed the following data within the electronic MEDICAL RECORD NUMBER       No acute findings on physical exam, EKG, or labs.     ____________________________________________   FINAL CLINICAL IMPRESSION  Well exam   ED Discharge Orders     None        Note:  This document was prepared using Dragon voice recognition software and may include unintentional dictation errors.

## 2024-01-13 DIAGNOSIS — R2689 Other abnormalities of gait and mobility: Secondary | ICD-10-CM | POA: Diagnosis not present

## 2024-01-13 DIAGNOSIS — M25561 Pain in right knee: Secondary | ICD-10-CM | POA: Diagnosis not present

## 2024-01-27 DIAGNOSIS — M25561 Pain in right knee: Secondary | ICD-10-CM | POA: Diagnosis not present

## 2024-01-27 DIAGNOSIS — R2689 Other abnormalities of gait and mobility: Secondary | ICD-10-CM | POA: Diagnosis not present

## 2024-03-19 ENCOUNTER — Ambulatory Visit: Payer: Self-pay | Admitting: Physician Assistant

## 2024-03-19 ENCOUNTER — Encounter: Payer: Self-pay | Admitting: Physician Assistant

## 2024-03-19 VITALS — BP 130/81 | HR 84 | Temp 98.6°F | Resp 16

## 2024-03-19 DIAGNOSIS — L02416 Cutaneous abscess of left lower limb: Secondary | ICD-10-CM

## 2024-03-19 NOTE — Progress Notes (Signed)
 Left anterior lower leg clogged pore x several weeks and reported only this location of left upper shin area.  No redness noted to other part of leg.  Stated hx of boils and infections in his life.  Denies fever.

## 2024-03-19 NOTE — Progress Notes (Signed)
   Subjective: Skin lesion left lower leg    Patient ID: Gabriel George, male    DOB: 1956/10/09, 68 y.o.   MRN: [id number]  HPI Patient presents for papular lesion on erythematous base to the left lower leg.  Onset of lesion was 3 days ago.  Patient area is tender.  Denies any provocative complaint.  Patient states in the past she has had a bad history of diffuse boils on his body.  No recent lesions in the last 5 years.   Review of Systems BP 130/81  Pulse Rate 84  Temp 98.6 F (37 C)  Resp 16  SpO2 95 %      Objective:   Physical Exam No acute distress.  Normal gait. Examination of the left lower leg reveals papular lesion on erythematous base.  No drainage.  Mild guarding with palpation.       Assessment & Plan: Cellulitis  Patient given a prescription for Bactrim DS and naproxen.  Advised warm compresses for 5 minutes twice a day.  Return back in 5 days for reevaluation.  Sooner if condition worsens.

## 2024-03-23 ENCOUNTER — Other Ambulatory Visit: Payer: Self-pay

## 2024-03-23 ENCOUNTER — Other Ambulatory Visit: Payer: Self-pay | Admitting: Physician Assistant

## 2024-03-23 MED ORDER — SULFAMETHOXAZOLE-TRIMETHOPRIM 800-160 MG PO TABS: 1.0000 | ORAL_TABLET | Freq: Two times a day (BID) | ORAL | 0 refills | Status: DC

## 2024-03-23 MED ORDER — NAPROXEN 500 MG PO TABS: 500.0000 mg | ORAL_TABLET | Freq: Two times a day (BID) | ORAL | 0 refills | Status: DC

## 2024-03-25 ENCOUNTER — Ambulatory Visit: Payer: Self-pay | Admitting: Physician Assistant

## 2024-04-01 ENCOUNTER — Ambulatory Visit: Payer: Self-pay | Admitting: Physician Assistant

## 2024-04-01 ENCOUNTER — Encounter: Payer: Self-pay | Admitting: Physician Assistant

## 2024-04-01 DIAGNOSIS — L570 Actinic keratosis: Secondary | ICD-10-CM

## 2024-04-01 NOTE — Progress Notes (Signed)
 Pt presents today for follow up on boil. No change, still tender. May have risen a little bit but no change in color. Gabriel George

## 2024-04-01 NOTE — Progress Notes (Signed)
   Subjective: Left leg lesion    Patient ID: Gabriel George, male    DOB: 07-Jun-1956, 68 y.o.   MRN: [id number]  HPI Patient follow-up for left lower anterior leg lesion suspected bacterial infection.  Patient was placed on course of antibiotics and returned today stated noticed no improvement except for the lesion has become hypertrophic.  No erythema, edema, or drainage.   Review of Systems Negative septa for above complaint    Objective:   Physical Exam Keratotic papule lesion left lower leg.  No edema, erythema or drainage.       Assessment & Plan: Keratotic lesion   Obtain patient consent for cryotherapy.  Patient given discharge care instructions and advised return back in 1 week if no improvement or worsening complaint.

## 2024-06-10 ENCOUNTER — Ambulatory Visit: Payer: Self-pay

## 2024-06-10 DIAGNOSIS — Z23 Encounter for immunization: Secondary | ICD-10-CM

## 2024-06-10 DIAGNOSIS — S80811A Abrasion, right lower leg, initial encounter: Secondary | ICD-10-CM

## 2024-06-10 NOTE — Progress Notes (Signed)
 Seen for abrasion to right posterior lower leg after stated scraped with a piece of metal.  Area appears superficial and abrasion with area cleansed with diluted prepared hydrogen peroxide and NSS and tolerated well with topical antibiotic ointment and large bandaid applied with extras if needed.  Tdap updated at provider suggestion after consent signed and tolerated well.  Will return if anything needed and no other complaints voiced.

## 2024-10-01 ENCOUNTER — Other Ambulatory Visit: Payer: Self-pay

## 2024-10-01 DIAGNOSIS — N529 Male erectile dysfunction, unspecified: Secondary | ICD-10-CM

## 2024-10-01 MED ORDER — SILDENAFIL CITRATE 100 MG PO TABS: 50.0000 mg | ORAL_TABLET | Freq: Every day | ORAL | 11 refills | Status: AC | PRN

## 2025-01-04 ENCOUNTER — Encounter

## 2025-01-04 DIAGNOSIS — Z0000 Encounter for general adult medical examination without abnormal findings: Secondary | ICD-10-CM

## 2025-01-07 ENCOUNTER — Encounter: Admitting: Physician Assistant
